# Patient Record
Sex: Female | Born: 1965 | Race: White | Hispanic: No | Smoking: Never smoker
Health system: Southern US, Community
[De-identification: ages and names within clinical notes are randomized; demographics above are authoritative.]

## PROBLEM LIST (undated history)

## (undated) DIAGNOSIS — D729 Disorder of white blood cells, unspecified: Secondary | ICD-10-CM

## (undated) DIAGNOSIS — Z973 Presence of spectacles and contact lenses: Secondary | ICD-10-CM

## (undated) DIAGNOSIS — E039 Hypothyroidism, unspecified: Secondary | ICD-10-CM

## (undated) DIAGNOSIS — F429 Obsessive-compulsive disorder, unspecified: Secondary | ICD-10-CM

## (undated) DIAGNOSIS — R112 Nausea with vomiting, unspecified: Secondary | ICD-10-CM

## (undated) DIAGNOSIS — T8859XA Other complications of anesthesia, initial encounter: Secondary | ICD-10-CM

## (undated) DIAGNOSIS — G629 Polyneuropathy, unspecified: Secondary | ICD-10-CM

## (undated) DIAGNOSIS — F419 Anxiety disorder, unspecified: Secondary | ICD-10-CM

## (undated) DIAGNOSIS — T4145XA Adverse effect of unspecified anesthetic, initial encounter: Secondary | ICD-10-CM

## (undated) DIAGNOSIS — Z9889 Other specified postprocedural states: Secondary | ICD-10-CM

## (undated) DIAGNOSIS — G501 Atypical facial pain: Secondary | ICD-10-CM

## (undated) HISTORY — PX: TONSILLECTOMY: SUR1361

## (undated) HISTORY — DX: Hypothyroidism, unspecified: E03.9

## (undated) HISTORY — DX: Polyneuropathy, unspecified: G62.9

---

## 2014-12-07 ENCOUNTER — Encounter: Payer: Self-pay | Admitting: Emergency Medicine

## 2014-12-07 ENCOUNTER — Ambulatory Visit: Payer: Self-pay

## 2014-12-07 ENCOUNTER — Emergency Department
Admission: EM | Admit: 2014-12-07 | Discharge: 2014-12-07 | Disposition: A | Discharge status: Home / Self Care | Payer: Self-pay | Attending: Emergency Medicine | Admitting: Emergency Medicine

## 2014-12-07 ENCOUNTER — Ambulatory Visit
Admission: EM | Admit: 2014-12-07 | Discharge: 2014-12-07 | Disposition: A | Discharge status: Home / Self Care | Payer: Self-pay | Attending: Family Medicine | Admitting: Family Medicine

## 2014-12-07 DIAGNOSIS — S82452A Displaced comminuted fracture of shaft of left fibula, initial encounter for closed fracture: Secondary | ICD-10-CM | POA: Insufficient documentation

## 2014-12-07 DIAGNOSIS — Y9289 Other specified places as the place of occurrence of the external cause: Secondary | ICD-10-CM | POA: Insufficient documentation

## 2014-12-07 DIAGNOSIS — Z79899 Other long term (current) drug therapy: Secondary | ICD-10-CM | POA: Insufficient documentation

## 2014-12-07 DIAGNOSIS — Y998 Other external cause status: Secondary | ICD-10-CM | POA: Insufficient documentation

## 2014-12-07 DIAGNOSIS — F419 Anxiety disorder, unspecified: Secondary | ICD-10-CM | POA: Insufficient documentation

## 2014-12-07 DIAGNOSIS — S82892A Other fracture of left lower leg, initial encounter for closed fracture: Secondary | ICD-10-CM

## 2014-12-07 DIAGNOSIS — W1839XA Other fall on same level, initial encounter: Secondary | ICD-10-CM | POA: Insufficient documentation

## 2014-12-07 DIAGNOSIS — Y9389 Activity, other specified: Secondary | ICD-10-CM | POA: Insufficient documentation

## 2014-12-07 HISTORY — DX: Anxiety disorder, unspecified: F41.9

## 2014-12-07 HISTORY — DX: Obsessive-compulsive disorder, unspecified: F42.9

## 2014-12-07 MED ORDER — KETOROLAC TROMETHAMINE 60 MG/2ML IM SOLN
60.0000 mg | Freq: Once | INTRAMUSCULAR | Status: AC
  Administered 2014-12-07: 60 mg via INTRAMUSCULAR

## 2014-12-07 MED ORDER — HYDROCODONE-ACETAMINOPHEN 5-325 MG PO TABS: 1.0000 | ORAL_TABLET | ORAL | Status: DC | PRN

## 2014-12-07 NOTE — ED Provider Notes (Signed)
CSN: [id number]     Arrival date & time 12/07/14  1506 History   First MD Initiated Contact with Patient 12/07/14 1609     Chief Complaint  Patient presents with  . Ankle Pain   (Consider location/radiation/quality/duration/timing/severity/associated sxs/prior Treatment) HPI  49 yo F twisted left ankle wearing high wedge shoes on Sunday  ( 3 days PTA) .Inversion injury.Pain and swelling steadily increasing- has been wearing gel stirrup she had at home Swelling now mid calf   Foot swollen and toes with decreased feeling. Has been walking on it at work today. New to area-just moved from [place]. Just establishing new job-tried to ignore pain to keep working.  Has no health insurance yet. Friend from [place] is here visiting and with her  Past Medical History  Diagnosis Date  . OCD (obsessive compulsive disorder)   . Anxiety    Past Surgical History  Procedure Laterality Date  . Tonsillectomy     Family History  Problem Relation Age of Onset  . Mental illness Mother    Social History  Substance Use Topics  . Smoking status: Never Smoker   . Smokeless tobacco: None  . Alcohol Use: Yes     Comment: daily   OB History    No data available     Review of Systems  Constitutional -afebrile Eyes-denies visual changes ENT- normal voice,denies sore throat CV-denies chest pain Resp-denies SOB GI- negative for nausea,vomiting, diarrhea GU- negative for dysuria MSK- negative for back pain, ambulatory antalgic, favoring left ankle very swollen Skin- denies acute changes Neuro- negative headache,focal weakness or numbness- left foot and toes with decreased sensation adequate ROM given amount of swelling when encouraged to move them      Allergies  Review of patient's allergies indicates no known allergies.  Home Medications   Prior to Admission medications   Medication Sig Start Date End Date Taking? Authorizing Provider  escitalopram (LEXAPRO) 5 MG tablet Take  5 mg by mouth daily.   Yes Historical Provider, MD  folic acid (FOLVITE) 1 MG tablet Take 1 mg by mouth daily.   Yes Historical Provider, MD  gabapentin (NEURONTIN) 300 MG capsule Take 300 mg by mouth 3 (three) times daily.   Yes Historical Provider, MD  levothyroxine (SYNTHROID, LEVOTHROID) 50 MCG tablet Take 50 mcg by mouth daily before breakfast.   Yes Historical Provider, MD  traZODone (DESYREL) 100 MG tablet Take 50 mg by mouth at bedtime as needed for sleep.   Yes Historical Provider, MD   Meds Ordered and Administered this Visit   Medications  ketorolac (TORADOL) injection 60 mg (60 mg Intramuscular Given 12/07/14 1719)    BP 152/85 mmHg  Pulse 72  Temp(Src) 97.6 F (36.4 C) (Tympanic)  Resp 16  Ht  (1.6 m)  Wt 158 lb (71.668 kg)  BMI 28.00 kg/m2  SpO2 100%  LMP 12/01/2014 (Exact Date) No data found.   Physical Exam   Constitutional -alert and oriented,well appearing and in mild/mod distress left ankle Head-atraumatic, normocephalic Eyes- conjunctiva normal, EOMI ,conjugate gaze Nose- no congestion or rhinorrhea Mouth/throat- mucous membranes moist Neck- supple without glandular enlargement CV- regular rate, grossly normal heart sounds,  Resp-no distress, normal respiratory effort,clear to auscultation bilaterally GI- soft,non-tender,no distention GU-  not examined MSK- left leg swollen from mid calf down including foot and toes; toes are pink and warm,good cap fill, but pulses cannot be evaluated given the swelling.She reports decreased sensation In lower leg and foot, increasingly notable in  past few hours. Calf is swollen but compresses with moderate discomfort and is soft. Left is larger than right (15 3/4 vs 14 5/8). No point tenderness. Lateral malleolus exquisitely tender to touch and medial is moderately tende, both swollen. She can flex and lift toes, can move ankle slightly only.Left midfoot is described as painful-compression does not increase pain but  significantly swollen limiting exam. Neuro- normal speech and language, no gross focal neurological deficit appreciated other than described left ankle/foot Skin-warm,dry ,intact; no rash noted Psych-mood and affect grossly normal; speech and behavior grossly normal  ED Course  Procedures (including critical care time)  Labs Review Labs Reviewed - No data to display  Imaging Review Dg Ankle Complete Left  12/07/2014   CLINICAL DATA:  Left ankle pain secondary to a fall 3 days ago.  EXAM: LEFT ANKLE COMPLETE - 3+ VIEW  COMPARISON:  None.  FINDINGS: There is a slightly displaced slightly comminuted spiral fracture of the distal fibula at the level of the ankle joint. There is circumferential soft tissue edema with an ankle joint effusion. The distal tibia is intact.  IMPRESSION: Slightly displaced comminuted spiral fracture of the distal fibula.   Electronically Signed   By: Francene Boyers M.D.   On: 12/07/2014 17:20   Dg Foot Complete Left  12/07/2014   CLINICAL DATA:  Left foot and ankle pain since a fall 3 days ago.  EXAM: LEFT FOOT - COMPLETE 3+ VIEW  COMPARISON:  None.  FINDINGS: There is a slightly displaced spiral fracture of the distal fibula. The bones of the foot are normal.  IMPRESSION: Normal left foot.  Distal fibula fracture.   Electronically Signed   By: Francene Boyers M.D.   On: 12/07/2014 17:19      MDM   1. Closed left ankle fracture, initial encounter    Plan: 1. X-ray results and diagnosis reviewed with patient and friend. She is new to area [facility] access reviewed with them . She opts for [facility] for proximity to her home/new friends. Attempts to reach Ortho on Call at [facility]  X 2 were unsuccessful- patient was counseled to report to ER for evaluation Given neurosensory changes, significant swelling and comminuted spiral fracture For Ortho evaluation given the report of increasing numbness and tingling in the foot. Direct referral to [facility] ER    In cam walker with  leg elevated. Private vehicle. [facility] notified   Rae Halsted, PA-C 12/08/14 [phone]

## 2014-12-07 NOTE — ED Notes (Signed)
Patient sent here from [facility] due to L ankle spiral fracture.

## 2014-12-07 NOTE — Discharge Instructions (Signed)
Please do not bear any weight with your left leg until Dr. Hyacinth Meeker says it is okay to do so.  Review the information provided and remember to keep your leg elevated as much as possible and use ice/cold packs appropriately to reduce swelling.  Read through the information about compartment syndrome; we do not believe this will happen, but it is important to know about.  Return to the emergency department with new or worsening symptoms that concern you.     Fibular Fracture, Ankle, Adult, Treated With or Without Immobilization A fibular fracture at your ankle is a break (fracture) bone in the smallest of the two bones in your lower leg, located on the outside of your leg (fibula) close to the area at your ankle joint. CAUSES  Rolling your ankle.  Twisting your ankle.  Extreme flexing or extending of your foot.  Severe force on your ankle as when falling from a distance. RISK FACTORS  Jumping activities.  Participation in sports.  Osteoporosis.  Advanced age.  Previous ankle injuries. SIGNS AND SYMPTOMS  Pain.  Swelling.  Inability to put weight on injured ankle.  Bruising.  Bone deformities at site of injury. DIAGNOSIS  This fracture is diagnosed with the help of an X-ray exam. TREATMENT  If the fractured bone did not move out of place it usually will heal without problems and does casting or splinting. If immobilization is needed for comfort or the fractured bone moved out of place and will not heal properly with immobilization, a cast or splint will be used. HOME CARE INSTRUCTIONS   Apply ice to the area of injury:  Put ice in a plastic bag.  Place a towel between your skin and the bag.  Leave the ice on for 20 minutes, 2-3 times a day.  Use crutches as directed. Resume walking without crutches as directed by your health care provider.  Only take over-the-counter or prescription medicines for pain, discomfort, or fever as directed by your health care  provider.  If you have a removable splint or boot, do not remove the boot unless directed by your health care provider. SEEK MEDICAL CARE IF:   You have continued pain or more swelling  The medications do not control the pain. SEEK IMMEDIATE MEDICAL CARE IF:  You develop severe pain in the leg or foot.  Your skin or nails below the injury turn blue or grey or feel cold or numb. MAKE SURE YOU:   Understand these instructions.  Will watch your condition.  Will get help right away if you are not doing well or get worse. Document Released: 03/11/2005 Document Revised: 12/30/2012 Document Reviewed: 10/21/2012 [facility] Patient Information 2015 [place], [place]. This information is not intended to replace advice given to you by your health care provider. Make sure you discuss any questions you have with your health care provider.

## 2014-12-07 NOTE — ED Provider Notes (Signed)
[facility] Emergency Department Provider Note  ____________________________________________  Time seen: Approximately 7:12 PM  I have reviewed the triage vital signs and the nursing notes.   HISTORY  Chief Complaint Ankle Pain    HPI Robin Madden is a 49 y.o. female with a history of anxiety but otherwise healthy who presents with pain and swelling to her left ankle and distal leg.  She reports that she sustained an inversion injury to the left ankle 3 days ago.  The pain was mild to moderate and she has actually been able to walk on the affected leg with a limp.  She has been keeping it elevated and using ice.  However, today she noticed significant swelling to the distal part of her leg into her foot and had some numbness and tingling of her toes.  She went to urgent care and had an x-ray which noted a slightly comminuted and slightly displaced distal fibular fracture.  She was sent to the emergency department for further evaluation.  She states that the pain is actually fairly mild.  She has normal sensation in the leg and foot but that sometimes her toes feel numb and tingly.  The swelling started today and is mild to moderate.  She states that the pain is actually fairly mild   Past Medical History  Diagnosis Date  . OCD (obsessive compulsive disorder)   . Anxiety     There are no active problems to display for this patient.   Past Surgical History  Procedure Laterality Date  . Tonsillectomy      Current Outpatient Rx  Name  Route  Sig  Dispense  Refill  . escitalopram (LEXAPRO) 5 MG tablet   Oral   Take 5 mg by mouth daily.         . folic acid (FOLVITE) 1 MG tablet   Oral   Take 1 mg by mouth daily.         Marland Kitchen gabapentin (NEURONTIN) 300 MG capsule   Oral   Take 300 mg by mouth 3 (three) times daily.         Marland Kitchen levothyroxine (SYNTHROID, LEVOTHROID) 50 MCG tablet   Oral   Take 50 mcg by mouth daily before breakfast.          . traZODone (DESYREL) 100 MG tablet   Oral   Take 50 mg by mouth at bedtime as needed for sleep.           Allergies Review of patient's allergies indicates no known allergies.  Family History  Problem Relation Age of Onset  . Mental illness Mother     Social History Social History  Substance Use Topics  . Smoking status: Never Smoker   . Smokeless tobacco: None  . Alcohol Use: Yes     Comment: daily    Review of Systems Constitutional: No fever/chills Eyes: No visual changes. ENT: No sore throat. Cardiovascular: Denies chest pain. Respiratory: Denies shortness of breath. Gastrointestinal: No abdominal pain.  No nausea, no vomiting.  No diarrhea.  No constipation. Genitourinary: Negative for dysuria. Musculoskeletal: pain and swelling in the distal left leg and ankle with some numbness and tingling in the toes. Skin: Negative for rash. Neurological: numbness and tingling in the left toes, otherwise no focal deficits  10-point ROS otherwise negative.  ____________________________________________   PHYSICAL EXAM:  VITAL SIGNS: ED Triage Vitals  Enc Vitals Group     BP 12/07/14 1848 171/82 mmHg     Pulse  Rate 12/07/14 1848 79     Resp 12/07/14 1848 20     Temp 12/07/14 1848 98.3 F (36.8 C)     Temp Source 12/07/14 1848 Oral     SpO2 12/07/14 1848 100 %     Weight 12/07/14 1848 145 lb (65.772 kg)     Height 12/07/14 1848 5\' 4"  (1.626 m)     Head Cir --      Peak Flow --      Pain Score 12/07/14 1849 8     Pain Loc --      Pain Edu? --      Excl. in GC? --     Constitutional: Alert and oriented. Well appearing and in no acute distress.mildly anxious. Eyes: Conjunctivae are normal. PERRL. EOMI. Head: Atraumatic. Neck: No stridor.  No cervical spine tenderness to palpation. Cardiovascular: Normal rate, regular rhythm. Grossly normal heart sounds.  Good peripheral circulation. Respiratory: Normal respiratory effort.  No retractions. Lungs  CTAB. Musculoskeletal: mild to moderate swelling of the left foot and left lower extremity below the knee.  There is tenderness to palpation throughout although it is severe at the lateral malleolus and just proximal to that consistent with the location of her fracture on x-ray.  Though she does have swelling throughout the extremity, her compartments are soft and easily compressible with minimal pain.  She has normal sensation to light touch throughout her leg and foot. Neurologic:  Normal speech and language. No gross focal neurologic deficits are appreciated.  Skin:  Skin is warm, dry and intact. No rash noted. Psychiatric: Mood and affect are normal. Speech and behavior are normal.  ____________________________________________   LABS (all labs ordered are listed, but only abnormal results are displayed)  Labs Reviewed - No data to display ____________________________________________  EKG  Not indicated ____________________________________________  RADIOLOGY I, Treina Arscott, personally viewed and evaluated these images (plain radiographs) as part of my medical decision making.   Dg Ankle Complete Left  12/07/2014   CLINICAL DATA:  Left ankle pain secondary to a fall 3 days ago.  EXAM: LEFT ANKLE COMPLETE - 3+ VIEW  COMPARISON:  None.  FINDINGS: There is a slightly displaced slightly comminuted spiral fracture of the distal fibula at the level of the ankle joint. There is circumferential soft tissue edema with an ankle joint effusion. The distal tibia is intact.  IMPRESSION: Slightly displaced comminuted spiral fracture of the distal fibula.   Electronically Signed   By: Francene Boyers M.D.   On: 12/07/2014 17:20   Dg Foot Complete Left  12/07/2014   CLINICAL DATA:  Left foot and ankle pain since a fall 3 days ago.  EXAM: LEFT FOOT - COMPLETE 3+ VIEW  COMPARISON:  None.  FINDINGS: There is a slightly displaced spiral fracture of the distal fibula. The bones of the foot are normal.   IMPRESSION: Normal left foot.  Distal fibula fracture.   Electronically Signed   By: Francene Boyers M.D.   On: 12/07/2014 17:19    ____________________________________________   PROCEDURES  Procedure(s) performed: splint, see procedure note(s).   SPLINT APPLICATION Date/Time: 7:49 PM Authorized by: Loleta Rose Consent: Verbal consent obtained. Risks and benefits: risks, benefits and alternatives were discussed Consent given by: patient Splint applied by: ED tech Location details: left lower extremity Splint type: posterior short leg Supplies used: orthoglass Post-procedure: The splinted body part was neurovascularly unchanged following the procedure. Patient tolerance: Patient tolerated the procedure well with no immediate complications.  Critical Care  performed: No ____________________________________________   INITIAL IMPRESSION / ASSESSMENT AND PLAN / ED COURSE  Pertinent labs & imaging results that were available during my care of the patient were reviewed by me and considered in my medical decision making (see chart for details).  I understand the concern for compartment syndrome but at this time the patient has no signs or symptoms of compartment syndrome.  She is having what I believe is a normal amount of swelling given her injury.  I explained this to her and her companion.  I have called Dr. Hyacinth Meeker for his evaluation.  ----------------------------------------- 7:29 PM on 12/07/2014 -----------------------------------------  Spoke by phone with Dr. Hyacinth Meeker who will review the imaging and call me back.  ----------------------------------------- 7:47 PM on 12/07/2014 -----------------------------------------  Dr. Hyacinth Meeker called back to let me know that he feels that immobilization in a posterior short leg splint and follow-up in 2 days in clinic as appropriate.I relayed this information to the patient and her companion and reiterated the RICE recommendations and  NWB.  She has crutches at home.  I gave my usual and customary return precautions including extensive discussion of compartment syndrome. ____________________________________________  FINAL CLINICAL IMPRESSION(S) / ED DIAGNOSES  Final diagnoses:  Closed displaced comminuted fracture of shaft of fibula, left, initial encounter      NEW MEDICATIONS STARTED DURING THIS VISIT:  New Prescriptions   No medications on file     Loleta Rose, MD 12/07/14 1949

## 2014-12-07 NOTE — ED Notes (Signed)
Wearing wedge shoes and rolled Sunday twisting left ankle. + swelling, and up calf. Left calf 15 3/4 inchs. Right calf mearsures 14 5/8 inches. "My foot feels numb"

## 2015-03-29 ENCOUNTER — Ambulatory Visit: Payer: Self-pay | Admitting: Internal Medicine

## 2015-04-02 ENCOUNTER — Encounter: Payer: Self-pay | Admitting: Internal Medicine

## 2015-04-04 ENCOUNTER — Ambulatory Visit (INDEPENDENT_AMBULATORY_CARE_PROVIDER_SITE_OTHER): Payer: Managed Care, Other (non HMO) | Admitting: Internal Medicine

## 2015-04-04 ENCOUNTER — Encounter: Payer: Self-pay | Admitting: Internal Medicine

## 2015-04-04 VITALS — BP 132/80 | HR 86 | Ht 64.0 in | Wt 160.6 lb

## 2015-04-04 DIAGNOSIS — Z1239 Encounter for other screening for malignant neoplasm of breast: Secondary | ICD-10-CM | POA: Diagnosis not present

## 2015-04-04 DIAGNOSIS — D729 Disorder of white blood cells, unspecified: Secondary | ICD-10-CM | POA: Diagnosis not present

## 2015-04-04 DIAGNOSIS — E034 Atrophy of thyroid (acquired): Secondary | ICD-10-CM

## 2015-04-04 DIAGNOSIS — Z8742 Personal history of other diseases of the female genital tract: Secondary | ICD-10-CM

## 2015-04-04 DIAGNOSIS — F429 Obsessive-compulsive disorder, unspecified: Secondary | ICD-10-CM | POA: Diagnosis not present

## 2015-04-04 DIAGNOSIS — G501 Atypical facial pain: Secondary | ICD-10-CM

## 2015-04-04 DIAGNOSIS — G4700 Insomnia, unspecified: Secondary | ICD-10-CM | POA: Diagnosis not present

## 2015-04-04 DIAGNOSIS — E038 Other specified hypothyroidism: Secondary | ICD-10-CM

## 2015-04-04 DIAGNOSIS — Z8781 Personal history of (healed) traumatic fracture: Secondary | ICD-10-CM

## 2015-04-04 DIAGNOSIS — Z23 Encounter for immunization: Secondary | ICD-10-CM

## 2015-04-04 DIAGNOSIS — F5101 Primary insomnia: Secondary | ICD-10-CM | POA: Insufficient documentation

## 2015-04-04 DIAGNOSIS — D7589 Other specified diseases of blood and blood-forming organs: Secondary | ICD-10-CM | POA: Diagnosis not present

## 2015-04-04 DIAGNOSIS — E039 Hypothyroidism, unspecified: Secondary | ICD-10-CM | POA: Insufficient documentation

## 2015-04-04 HISTORY — DX: Personal history of (healed) traumatic fracture: Z87.81

## 2015-04-04 HISTORY — DX: Personal history of other diseases of the female genital tract: Z87.42

## 2015-04-04 MED ORDER — LEVOTHYROXINE SODIUM 100 MCG PO TABS: 100.0000 ug | ORAL_TABLET | Freq: Every day | ORAL | Status: DC

## 2015-04-04 MED ORDER — ZOLPIDEM TARTRATE 5 MG PO TABS: 5.0000 mg | ORAL_TABLET | Freq: Every evening | ORAL | Status: DC | PRN

## 2015-04-04 MED ORDER — ESCITALOPRAM OXALATE 5 MG PO TABS: 5.0000 mg | ORAL_TABLET | Freq: Every day | ORAL | Status: DC

## 2015-04-04 MED ORDER — GABAPENTIN 300 MG PO CAPS: 300.0000 mg | ORAL_CAPSULE | Freq: Every day | ORAL | Status: DC

## 2015-04-04 NOTE — Progress Notes (Signed)
Date:  04/04/2015   Name:  Robin Madden   DOB:  [birth year]   MRN:  [id number]   Chief Complaint: New Evaluation and Thyroid Problem Thyroid Problem Presents for follow-up visit. Patient reports no anxiety, depressed mood, diarrhea, fatigue, heat intolerance, hoarse voice, leg swelling, menstrual problem or palpitations. The symptoms have been stable. Past treatments include levothyroxine. The treatment provided significant (has been on supplements for years) relief.   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 Please evaluation by neurology ruled out trigeminal neuralgia, sinusitis, and migraine. She was treated with Neurontin which she took for a period of time and then discontinued. His past week she had some occurrence of symptoms so resumed 300 mg at bedtime with good results.  Mild OCD - she has been taking Lexapro 5 mg daily for OCD. She ran out of her medication about one month ago and has noted some mild increase in symptoms. She believes she would like to keep taking the medication because she still adjusting to her move and her new job.   Intermittent panic disorder - she has low-dose lorazepam which she uses very rarely for extreme anxiety inducing situations. She currently has sufficient tablets to last her for a number of months.  Chronic insomnia - patient has been taking trazodone 50 mg at bedtime intermittently for some time. It helps her sleep but she has very vivid nightmares and often also has drenching night sweats. She has tried Ambien briefly in the past with good results. She is also sleeping better since taking gabapentin.  History of abnormal Pap smear with HPV positive - last Pap was 1 year ago was normal. She has had no history of LEEP or conization. She has had a cervical biopsy which was benign.  Hematologic abnormality - patient states she's been evaluated in the past for red blood cell microcytosis as well as an  abnormal white blood count. She is uncertain whether her white count is high or low. Pathology has cleared her for any malignancy or neoplastic process. They recommend that she take folate, B6 and B12 daily.  Fibula fracture - she sustained a fibular fracture when she turned her ankle in July. Has been seen by orthopedics and they recommend conservative treatment out surgery. She still has some ankle pain with movement but it is improving.  Review of Systems  Constitutional: Negative for fever, chills and fatigue.  HENT: Positive for facial swelling (and pain on right). Negative for hoarse voice, trouble swallowing and voice change.   Respiratory: Negative for cough, chest tightness and shortness of breath.   Cardiovascular: Negative for chest pain and palpitations.  Gastrointestinal: Negative for abdominal pain and diarrhea.  Endocrine: Negative for heat intolerance.  Genitourinary: Negative for vaginal discharge, vaginal pain, menstrual problem and pelvic pain.  Neurological: Negative for syncope, light-headedness and headaches.  Psychiatric/Behavioral: Positive for sleep disturbance. Negative for dysphoric mood and decreased concentration. The patient is not nervous/anxious.     There are no active problems to display for this patient.   Prior to Admission medications   Medication Sig Start Date End Date Taking? Authorizing Provider  gabapentin (NEURONTIN) 300 MG capsule Take 300 mg by mouth 3 (three) times daily.   Yes Historical Provider, MD  l-methylfolate-B6-B12 (METANX) 3-35-2 MG TABS tablet Take 1 tablet by mouth daily.   Yes Historical Provider, MD  levothyroxine (SYNTHROID, LEVOTHROID) 100 MCG tablet Take 100 mcg by mouth daily before breakfast.  Yes Historical Provider, MD  LORazepam (ATIVAN) 0.5 MG tablet Take 0.5 mg by mouth as needed for anxiety.   Yes Historical Provider, MD  traZODone (DESYREL) 100 MG tablet Take 50 mg by mouth at bedtime as needed for sleep.   Yes  Historical Provider, MD  escitalopram (LEXAPRO) 5 MG tablet Take 5 mg by mouth daily. Reported on 04/04/2015    Historical Provider, MD    No Known Allergies  Past Surgical History  Procedure Laterality Date  . Tonsillectomy      Social History  Substance Use Topics  . Smoking status: Never Smoker   . Smokeless tobacco: None  . Alcohol Use: 0.0 oz/week    0 Standard drinks or equivalent per week     Comment: daily     Medication list has been reviewed and updated.   Physical Exam  Constitutional: She is oriented to person, place, and time. She appears well-developed. No distress.  HENT:  Head: Normocephalic and atraumatic.  Eyes: Conjunctivae are normal. Right eye exhibits no discharge. Left eye exhibits no discharge. No scleral icterus.  Neck: Normal range of motion. Neck supple. No thyromegaly present.  Cardiovascular: Normal rate, regular rhythm and normal heart sounds.   Pulmonary/Chest: Effort normal and breath sounds normal. No respiratory distress. She has no wheezes.  Musculoskeletal:       Left ankle: She exhibits decreased range of motion. Tenderness. Lateral malleolus tenderness found.  Lymphadenopathy:    She has no cervical adenopathy.  Neurological: She is alert and oriented to person, place, and time. She has normal reflexes.  Skin: Skin is warm and dry. No rash noted.  Psychiatric: She has a normal mood and affect. Her behavior is normal. Thought content normal.  Nursing note and vitals reviewed.   BP 132/80 mmHg  Pulse 86  Ht 5\' 4"  (1.626 m)  Wt 160 lb 9.6 oz (72.848 kg)  BMI 27.55 kg/m2  SpO2 100%  LMP 03/23/2015  Assessment and Plan: 1. Hypothyroidism due to acquired atrophy of thyroid Continue supplements Check labs at return visit - levothyroxine (SYNTHROID, LEVOTHROID) 100 MCG tablet; Take 1 tablet (100 mcg total) by mouth daily before breakfast.  Dispense: 30 tablet; Refill: 5  2. OCD (obsessive compulsive disorder) Continue Lexapro -  escitalopram (LEXAPRO) 5 MG tablet; Take 1 tablet (5 mg total) by mouth daily. Reported on 04/04/2015  Dispense: 30 tablet; Refill: 0  3. Atypical facial pain Improved with gabapentin - gabapentin (NEURONTIN) 300 MG capsule; Take 1 capsule (300 mg total) by mouth at bedtime.  Dispense: 30 capsule; Refill: 5  4. Insomnia Discontinue trazodone and begin Ambien as needed - zolpidem (AMBIEN) 5 MG tablet; Take 1 tablet (5 mg total) by mouth at bedtime as needed for sleep.  Dispense: 30 tablet; Refill: 1  5. Abnormal WBC count Will monitor with labs  6. Macrocytosis Continue folic acid and B12 and B6 supplements  7. Hx of abnormal cervical Pap smear We will repeat at time of her physical  8. Breast cancer screening - MM DIGITAL SCREENING BILATERAL; Future  9. Need for influenza vaccination - Flu Vaccine QUAD 36+ mos IM  10. History of fracture of fibula Continue follow up with Orthopedics   Bari EdwardLaura Berglund, MD [facility]  04/04/2015

## 2015-04-12 ENCOUNTER — Ambulatory Visit
Admission: RE | Admit: 2015-04-12 | Discharge: 2015-04-12 | Disposition: A | Discharge status: Home / Self Care | Payer: Managed Care, Other (non HMO) | Source: Ambulatory Visit | Attending: Internal Medicine | Admitting: Internal Medicine

## 2015-04-12 DIAGNOSIS — Z1239 Encounter for other screening for malignant neoplasm of breast: Secondary | ICD-10-CM

## 2015-04-12 DIAGNOSIS — Z1231 Encounter for screening mammogram for malignant neoplasm of breast: Secondary | ICD-10-CM | POA: Diagnosis not present

## 2015-05-11 ENCOUNTER — Other Ambulatory Visit: Payer: Self-pay | Admitting: Internal Medicine

## 2015-07-11 ENCOUNTER — Encounter: Payer: Managed Care, Other (non HMO) | Admitting: Internal Medicine

## 2015-09-20 ENCOUNTER — Ambulatory Visit (INDEPENDENT_AMBULATORY_CARE_PROVIDER_SITE_OTHER): Payer: Managed Care, Other (non HMO) | Admitting: Internal Medicine

## 2015-09-20 ENCOUNTER — Encounter: Payer: Self-pay | Admitting: Internal Medicine

## 2015-09-20 VITALS — BP 120/82 | HR 69 | Resp 16 | Ht 64.0 in | Wt 156.2 lb

## 2015-09-20 DIAGNOSIS — G501 Atypical facial pain: Secondary | ICD-10-CM

## 2015-09-20 DIAGNOSIS — F429 Obsessive-compulsive disorder, unspecified: Secondary | ICD-10-CM | POA: Diagnosis not present

## 2015-09-20 DIAGNOSIS — Z8742 Personal history of other diseases of the female genital tract: Secondary | ICD-10-CM | POA: Diagnosis not present

## 2015-09-20 DIAGNOSIS — G4700 Insomnia, unspecified: Secondary | ICD-10-CM

## 2015-09-20 DIAGNOSIS — E038 Other specified hypothyroidism: Secondary | ICD-10-CM

## 2015-09-20 DIAGNOSIS — Z0000 Encounter for general adult medical examination without abnormal findings: Secondary | ICD-10-CM | POA: Diagnosis not present

## 2015-09-20 DIAGNOSIS — E034 Atrophy of thyroid (acquired): Secondary | ICD-10-CM | POA: Diagnosis not present

## 2015-09-20 DIAGNOSIS — Z23 Encounter for immunization: Secondary | ICD-10-CM | POA: Diagnosis not present

## 2015-09-20 LAB — POCT URINALYSIS DIPSTICK
Bilirubin, UA: NEGATIVE
GLUCOSE UA: NEGATIVE
KETONES UA: NEGATIVE
LEUKOCYTES UA: NEGATIVE
NITRITE UA: NEGATIVE
Protein, UA: NEGATIVE
RBC UA: NEGATIVE
SPEC GRAV UA: 1.02
Urobilinogen, UA: 0.2
pH, UA: 5

## 2015-09-20 MED ORDER — LEVOTHYROXINE SODIUM 100 MCG PO TABS: 100.0000 ug | ORAL_TABLET | Freq: Every day | ORAL | Status: DC

## 2015-09-20 MED ORDER — ESCITALOPRAM OXALATE 10 MG PO TABS: 10.0000 mg | ORAL_TABLET | Freq: Every day | ORAL | Status: DC

## 2015-09-20 MED ORDER — ZOLPIDEM TARTRATE 5 MG PO TABS: 5.0000 mg | ORAL_TABLET | Freq: Every evening | ORAL | Status: DC | PRN

## 2015-09-20 MED ORDER — GABAPENTIN 300 MG PO CAPS: 300.0000 mg | ORAL_CAPSULE | Freq: Every day | ORAL | Status: DC

## 2015-09-20 MED ORDER — TRAZODONE HCL 50 MG PO TABS: 50.0000 mg | ORAL_TABLET | Freq: Every evening | ORAL | Status: DC | PRN

## 2015-09-20 NOTE — Patient Instructions (Addendum)
Breast Self-Awareness Practicing breast self-awareness may pick up problems early, prevent significant medical complications, and possibly save your life. By practicing breast self-awareness, you can become familiar with how your breasts look and feel and if your breasts are changing. This allows you to notice changes early. It can also offer you some reassurance that your breast health is good. One way to learn what is normal for your breasts and whether your breasts are changing is to do a breast self-exam. If you find a lump or something that was not present in the past, it is best to contact your caregiver right away. Other findings that should be evaluated by your caregiver include nipple discharge, especially if it is bloody; skin changes or reddening; areas where the skin seems to be pulled in (retracted); or new lumps and bumps. Breast pain is seldom associated with cancer (malignancy), but should also be evaluated by a caregiver. HOW TO PERFORM A BREAST SELF-EXAM The best time to examine your breasts is 5-7 days after your menstrual period is over. During menstruation, the breasts are lumpier, and it may be more difficult to pick up changes. If you do not menstruate, have reached menopause, or had your uterus removed (hysterectomy), you should examine your breasts at regular intervals, such as monthly. If you are breastfeeding, examine your breasts after a feeding or after using a breast pump. Breast implants do not decrease the risk for lumps or tumors, so continue to perform breast self-exams as recommended. Talk to your caregiver about how to determine the difference between the implant and breast tissue. Also, talk about the amount of pressure you should use during the exam. Over time, you will become more familiar with the variations of your breasts and more comfortable with the exam. A breast self-exam requires you to remove all your clothes above the waist.  Look at your breasts and nipples.  Stand in front of a mirror in a room with good lighting. With your hands on your hips, push your hands firmly downward. Look for a difference in shape, contour, and size from one breast to the other (asymmetry). Asymmetry includes puckers, dips, or bumps. Also, look for skin changes, such as reddened or scaly areas on the breasts. Look for nipple changes, such as discharge, dimpling, repositioning, or redness.  Carefully feel your breasts. This is best done either in the shower or tub while using soapy water or when flat on your back. Place the arm (on the side of the breast you are examining) above your head. Use the pads (not the fingertips) of your three middle fingers on your opposite hand to feel your breasts. Start in the underarm area and use  inch (2 cm) overlapping circles to feel your breast. Use 3 different levels of pressure (light, medium, and firm pressure) at each circle before moving to the next circle. The light pressure is needed to feel the tissue closest to the skin. The medium pressure will help to feel breast tissue a little deeper, while the firm pressure is needed to feel the tissue close to the ribs. Continue the overlapping circles, moving downward over the breast until you feel your ribs below your breast. Then, move one finger-width towards the center of the body. Continue to use the  inch (2 cm) overlapping circles to feel your breast as you move slowly up toward the collar bone (clavicle) near the base of the neck. Continue the up and down exam using all 3 pressures until you reach the   middle of the chest. Do this with each breast, carefully feeling for lumps or changes.   Keep a written record with breast changes or normal findings for each breast. By writing this information down, you do not need to depend only on memory for size, tenderness, or location. Write down where you are in your menstrual cycle, if you are still menstruating. Breast tissue can have some lumps or thick  tissue. However, see your caregiver if you find anything that concerns you.  SEEK MEDICAL CARE IF:  You see a change in shape, contour, or size of your breasts or nipples.   You see skin changes, such as reddened or scaly areas on the breasts or nipples.   You have an unusual discharge from your nipples.   You feel a new lump or unusually thick areas.    This information is not intended to replace advice given to you by your health care provider. Make sure you discuss any questions you have with your health care provider.   Document Released: 03/11/2005 Document Revised: 02/26/2012 Document Reviewed: 06/26/2011 Elsevier Interactive Patient Education 2016 [employer] Tdap Vaccine (Tetanus, Diphtheria and Pertussis): What You Need to Know 1. Why get vaccinated? Tetanus, diphtheria and pertussis are very serious diseases. Tdap vaccine can protect us from these diseases. And, Tdap vaccine given to pregnant women can protect newborn babies against pertussis. TETANUS (Lockjaw) is rare in the [place] today. It causes painful muscle tightening and stiffness, usually all over the body.  It can lead to tightening of muscles in the head and neck so you can't open your mouth, swallow, or sometimes even breathe. Tetanus kills about 1 out of 10 people who are infected even after receiving the best medical care. DIPHTHERIA is also rare in the [place] today. It can cause a thick coating to form in the back of the throat.  It can lead to breathing problems, heart failure, paralysis, and death. PERTUSSIS (Whooping Cough) causes severe coughing spells, which can cause difficulty breathing, vomiting and disturbed sleep.  It can also lead to weight loss, incontinence, and rib fractures. Up to 2 in 100 adolescents and 5 in 100 adults with pertussis are hospitalized or have complications, which could include pneumonia or death. These diseases are caused by bacteria. Diphtheria and pertussis  are spread from person to person through secretions from coughing or sneezing. Tetanus enters the body through cuts, scratches, or wounds. Before vaccines, as many as 200,000 cases of diphtheria, 200,000 cases of pertussis, and hundreds of cases of tetanus, were reported in the [place] each year. Since vaccination began, reports of cases for tetanus and diphtheria have dropped by about 99% and for pertussis by about 80%. 2. Tdap vaccine Tdap vaccine can protect adolescents and adults from tetanus, diphtheria, and pertussis. One dose of Tdap is routinely given at age 11 or 12. People who did not get Tdap at that age should get it as soon as possible. Tdap is especially important for healthcare professionals and anyone having close contact with a baby younger than 12 months. Pregnant women should get a dose of Tdap during every pregnancy, to protect the newborn from pertussis. Infants are most at risk for severe, life-threatening complications from pertussis. Another vaccine, called Td, protects against tetanus and diphtheria, but not pertussis. A Td booster should be given every 10 years. Tdap may be given as one of these boosters if you have never gotten Tdap before. Tdap may also be given after a severe   cut or burn to prevent tetanus infection. Your doctor or the person giving you the vaccine can give you more information. Tdap may safely be given at the same time as other vaccines. 3. Some people should not get this vaccine  A person who has ever had a life-threatening allergic reaction after a previous dose of any diphtheria, tetanus or pertussis containing vaccine, OR has a severe allergy to any part of this vaccine, should not get Tdap vaccine. Tell the person giving the vaccine about any severe allergies.  Anyone who had coma or long repeated seizures within 7 days after a childhood dose of DTP or DTaP, or a previous dose of Tdap, should not get Tdap, unless a cause other than the vaccine  was found. They can still get Td.  Talk to your doctor if you:  have seizures or another nervous system problem,  had severe pain or swelling after any vaccine containing diphtheria, tetanus or pertussis,  ever had a condition called Guillain-Barr Syndrome (GBS),  aren't feeling well on the day the shot is scheduled. 4. Risks With any medicine, including vaccines, there is a chance of side effects. These are usually mild and go away on their own. Serious reactions are also possible but are rare. Most people who get Tdap vaccine do not have any problems with it. Mild problems following Tdap (Did not interfere with activities)  Pain where the shot was given (about 3 in 4 adolescents or 2 in 3 adults)  Redness or swelling where the shot was given (about 1 person in 5)  Mild fever of at least 100.4F (up to about 1 in 25 adolescents or 1 in 100 adults)  Headache (about 3 or 4 people in 10)  Tiredness (about 1 person in 3 or 4)  Nausea, vomiting, diarrhea, stomach ache (up to 1 in 4 adolescents or 1 in 10 adults)  Chills, sore joints (about 1 person in 10)  Body aches (about 1 person in 3 or 4)  Rash, swollen glands (uncommon) Moderate problems following Tdap (Interfered with activities, but did not require medical attention)  Pain where the shot was given (up to 1 in 5 or 6)  Redness or swelling where the shot was given (up to about 1 in 16 adolescents or 1 in 12 adults)  Fever over 102F (about 1 in 100 adolescents or 1 in 250 adults)  Headache (about 1 in 7 adolescents or 1 in 10 adults)  Nausea, vomiting, diarrhea, stomach ache (up to 1 or 3 people in 100)  Swelling of the entire arm where the shot was given (up to about 1 in 500). Severe problems following Tdap (Unable to perform usual activities; required medical attention)  Swelling, severe pain, bleeding and redness in the arm where the shot was given (rare). Problems that could happen after any  vaccine:  People sometimes faint after a medical procedure, including vaccination. Sitting or lying down for about 15 minutes can help prevent fainting, and injuries caused by a fall. Tell your doctor if you feel dizzy, or have vision changes or ringing in the ears.  Some people get severe pain in the shoulder and have difficulty moving the arm where a shot was given. This happens very rarely.  Any medication can cause a severe allergic reaction. Such reactions from a vaccine are very rare, estimated at fewer than 1 in a million doses, and would happen within a few minutes to a few hours after the vaccination. As with any medicine,   there is a very remote chance of a vaccine causing a serious injury or death. The safety of vaccines is always being monitored. For more information, visit: www.cdc.gov/vaccinesafety/ 5. What if there is a serious problem? What should I look for?  Look for anything that concerns you, such as signs of a severe allergic reaction, very high fever, or unusual behavior.  Signs of a severe allergic reaction can include hives, swelling of the face and throat, difficulty breathing, a fast heartbeat, dizziness, and weakness. These would usually start a few minutes to a few hours after the vaccination. What should I do?  If you think it is a severe allergic reaction or other emergency that can't wait, call 9-1-1 or get the person to the nearest hospital. Otherwise, call your doctor.  Afterward, the reaction should be reported to the Vaccine Adverse Event Reporting System (VAERS). Your doctor might file this report, or you can do it yourself through the VAERS web site at www.vaers.hhs.gov, or by calling 1-800-822-7967. VAERS does not give medical advice.  6. The National Vaccine Injury Compensation Program The National Vaccine Injury Compensation Program (VICP) is a federal program that was created to compensate people who may have been injured by certain vaccines. Persons who  believe they may have been injured by a vaccine can learn about the program and about filing a claim by calling 1-800-338-2382 or visiting the VICP website at www.hrsa.gov/vaccinecompensation. There is a time limit to file a claim for compensation. 7. How can I learn more?  Ask your doctor. He or she can give you the vaccine package insert or suggest other sources of information.  Call your local or state health department.  Contact the Centers for Disease Control and Prevention (CDC):  Call 1-800-232-4636 (1-800-CDC-INFO) or  Visit CDC's website at www.cdc.gov/vaccines CDC Tdap Vaccine VIS (05/18/13)   This information is not intended to replace advice given to you by your health care provider. Make sure you discuss any questions you have with your health care provider.   Document Released: 09/10/2011 Document Revised: 04/01/2014 Document Reviewed: 06/23/2013 Elsevier Interactive Patient Education 2016 [employer] Inc[employer]  

## 2015-09-20 NOTE — Progress Notes (Signed)
Date:  09/20/2015   Name:  Robin Madden   DOB:  10-31-1965   MRN:  [id number]   Chief Complaint: Annual Exam Robin M Stevphen RochesterFrenette is a 50 y.o. female who presents today for her Complete Annual Exam. She feels well. She reports exercising regularly. She reports she is sleeping fairly well using ambien and trazodone. She denies any breast mass or tenderness.  She has still been having periods but is a few days late today.  She takes lexapro for anxiety - previously on 5 mg but tried 10 mg and felt better.   Review of Systems  Constitutional: Negative for fever, chills and fatigue.  HENT: Negative for congestion, hearing loss, tinnitus, trouble swallowing and voice change.   Eyes: Negative for visual disturbance.  Respiratory: Negative for cough, chest tightness, shortness of breath and wheezing.   Cardiovascular: Negative for chest pain, palpitations and leg swelling.  Gastrointestinal: Negative for vomiting, abdominal pain, diarrhea and constipation.  Endocrine: Negative for polydipsia and polyuria.  Genitourinary: Negative for dysuria, frequency, vaginal discharge, genital sores, menstrual problem and pelvic pain.  Musculoskeletal: Negative for joint swelling, arthralgias and gait problem.  Skin: Negative for color change and rash.  Neurological: Negative for dizziness, tremors, light-headedness and headaches.  Hematological: Negative for adenopathy. Does not bruise/bleed easily.  Psychiatric/Behavioral: Positive for sleep disturbance. Negative for confusion, dysphoric mood and agitation. The patient is nervous/anxious.     Patient Active Problem List   Diagnosis Date Noted  . Hypothyroidism 04/04/2015  . OCD (obsessive compulsive disorder) 04/04/2015  . 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 Insomnia 04/04/2015  . Abnormal WBC count 04/04/2015  . Macrocytosis 04/04/2015  . Hx of abnormal cervical Pap smear 04/04/2015  . History of fracture of fibula 04/04/2015    Prior to  Admission medications   Medication Sig Start Date End Date Taking? Authorizing Provider  escitalopram (LEXAPRO) 10 MG tablet  08/14/15   Historical Provider, MD  gabapentin (NEURONTIN) 300 MG capsule Take 1 capsule (300 mg total) by mouth at bedtime. 04/04/15   Reubin MilanLaura H Cornie Herrington, MD  l-methylfolate-B6-B12 (METANX) 3-35-2 MG TABS tablet Take 1 tablet by mouth daily.    Historical Provider, MD  levothyroxine (SYNTHROID, LEVOTHROID) 100 MCG tablet Take 1 tablet (100 mcg total) by mouth daily before breakfast. 04/04/15   Reubin MilanLaura H Kalsey Lull, MD  LORazepam (ATIVAN) 0.5 MG tablet Take 0.5 mg by mouth as needed for anxiety.    Historical Provider, MD  traZODone (DESYREL) 100 MG tablet Take 50 mg by mouth at bedtime as needed for sleep.    Historical Provider, MD  zolpidem (AMBIEN) 5 MG tablet Take 1 tablet (5 mg total) by mouth at bedtime as needed for sleep. 04/04/15   Reubin MilanLaura H Zaria Taha, MD    No Known Allergies  Past Surgical History  Procedure Laterality Date  . Tonsillectomy      Social History  Substance Use Topics  . Smoking status: Never Smoker   . Smokeless tobacco: None  . Alcohol Use: 0.0 oz/week    0 Standard drinks or equivalent per week     Comment: daily     Medication list has been reviewed and updated.   Physical Exam  Constitutional: She is oriented to person, place, and time. She appears well-developed and well-nourished. No distress.  HENT:  Head: Normocephalic and atraumatic.  Right Ear: Tympanic membrane and ear canal normal.  Left Ear: Tympanic membrane and ear canal normal.  Nose: Right sinus exhibits no maxillary sinus  tenderness. Left sinus exhibits no maxillary sinus tenderness.  Mouth/Throat: Uvula is midline and oropharynx is clear and moist.  Eyes: Conjunctivae and EOM are normal. Right eye exhibits no discharge. Left eye exhibits no discharge. No scleral icterus.  Neck: Normal range of motion. Carotid bruit is not present. No erythema present. No thyromegaly  present.  Cardiovascular: Normal rate, regular rhythm, normal heart sounds and normal pulses.   Pulmonary/Chest: Effort normal and breath sounds normal. No respiratory distress. She has no wheezes. Right breast exhibits no mass, no nipple discharge, no skin change and no tenderness. Left breast exhibits no mass, no nipple discharge, no skin change and no tenderness.  Abdominal: Soft. Bowel sounds are normal. There is no hepatosplenomegaly. There is no tenderness. There is no CVA tenderness.  Genitourinary: Rectum normal, vagina normal and uterus normal. There is no tenderness, lesion or injury on the right labia. There is no tenderness, lesion or injury on the left labia. Cervix exhibits no motion tenderness, no discharge and no friability. Right adnexum displays no mass, no tenderness and no fullness. Left adnexum displays no mass, no tenderness and no fullness.    Musculoskeletal: Normal range of motion.  Lymphadenopathy:    She has no cervical adenopathy.    She has no axillary adenopathy.  Neurological: She is alert and oriented to person, place, and time. She has normal reflexes. No cranial nerve deficit or sensory deficit.  Skin: Skin is warm, dry and intact. No rash noted.  Psychiatric: She has a normal mood and affect. Her speech is normal and behavior is normal. Thought content normal.  Nursing note and vitals reviewed.   BP 120/82 mmHg  Pulse 69  Resp 16  Ht 5\' 4"  (1.626 m)  Wt 156 lb 3.2 oz (70.852 kg)  BMI 26.80 kg/m2  SpO2 100%  LMP 08/22/2015 (Exact Date)  Assessment and Plan: 1. Annual physical exam Pt will check on Cologuard coverage and call if desired - CBC with Differential/Platelet - Comprehensive metabolic panel - Lipid panel - POCT urinalysis dipstick  2. Need for Tdap vaccination - Tdap vaccine greater than or equal to 7yo IM  3. Hx of abnormal cervical Pap smear Repeated today - exam essentially normal Recommend remaining off of OCPs - Pap IG and HPV  (high risk) DNA detection  4. 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  Dispense: 30 tablet; Refill: 5  5. OCD (obsessive compulsive disorder) - escitalopram (LEXAPRO) 10 MG tablet; Take 1 tablet (10 mg total) by mouth daily.  Dispense: 30 tablet; Refill: 5  6. Atypical facial pain - gabapentin (NEURONTIN) 300 MG capsule; Take 1 capsule (300 mg total) by mouth at bedtime.  Dispense: 30 capsule; Refill: 5  7. Insomnia - zolpidem (AMBIEN) 5 MG tablet; Take 1 tablet (5 mg total) by mouth at bedtime as needed for sleep.  Dispense: 30 tablet; Refill: 2 - traZODone (DESYREL) 50 MG tablet; Take 1 tablet (50 mg total) by mouth at bedtime as needed for sleep.  Dispense: 30 tablet; Refill: 5   Bari EdwardLaura Bryor Rami, MD [facility]  09/20/2015

## 2015-09-21 LAB — COMPREHENSIVE METABOLIC PANEL
A/G RATIO: 1.7 (ref 1.2–2.2)
ALBUMIN: 4.6 g/dL (ref 3.5–5.5)
ALK PHOS: 45 IU/L (ref 39–117)
ALT: 7 IU/L (ref 0–32)
AST: 16 IU/L (ref 0–40)
BUN / CREAT RATIO: 18 (ref 9–23)
BUN: 13 mg/dL (ref 6–24)
Bilirubin Total: 0.3 mg/dL (ref 0.0–1.2)
CHLORIDE: 100 mmol/L (ref 96–106)
CO2: 23 mmol/L (ref 18–29)
CREATININE: 0.73 mg/dL (ref 0.57–1.00)
Calcium: 9.4 mg/dL (ref 8.7–10.2)
GFR calc Af Amer: 111 mL/min/{1.73_m2} (ref >59)
GFR, EST NON AFRICAN AMERICAN: 96 mL/min/{1.73_m2} (ref >59)
GLOBULIN, TOTAL: 2.7 g/dL (ref 1.5–4.5)
Glucose: 96 mg/dL (ref 65–99)
POTASSIUM: 4.1 mmol/L (ref 3.5–5.2)
SODIUM: 140 mmol/L (ref 134–144)
Total Protein: 7.3 g/dL (ref 6.0–8.5)

## 2015-09-21 LAB — TSH: TSH: 1.07 u[IU]/mL (ref 0.450–4.500)

## 2015-09-21 LAB — LIPID PANEL
Chol/HDL Ratio: 3.1 ratio units (ref 0.0–4.4)
Cholesterol, Total: 289 mg/dL — ABNORMAL HIGH (ref 100–199)
HDL: 92 mg/dL (ref >39)
LDL Calculated: 172 mg/dL — ABNORMAL HIGH (ref 0–99)
Triglycerides: 126 mg/dL (ref 0–149)
VLDL Cholesterol Cal: 25 mg/dL (ref 5–40)

## 2015-09-21 LAB — CBC WITH DIFFERENTIAL/PLATELET
BASOS: 0 %
Basophils Absolute: 0 10*3/uL (ref 0.0–0.2)
EOS (ABSOLUTE): 0 10*3/uL (ref 0.0–0.4)
EOS: 1 %
HEMATOCRIT: 37.5 % (ref 34.0–46.6)
HEMOGLOBIN: 12.8 g/dL (ref 11.1–15.9)
Immature Grans (Abs): 0 10*3/uL (ref 0.0–0.1)
Immature Granulocytes: 0 %
LYMPHS ABS: 1.2 10*3/uL (ref 0.7–3.1)
Lymphs: 42 %
MCH: 36.3 pg — ABNORMAL HIGH (ref 26.6–33.0)
MCHC: 34.1 g/dL (ref 31.5–35.7)
MCV: 106 fL — AB (ref 79–97)
MONOCYTES: 17 %
MONOS ABS: 0.5 10*3/uL (ref 0.1–0.9)
NEUTROS ABS: 1.1 10*3/uL — AB (ref 1.4–7.0)
Neutrophils: 40 %
Platelets: 233 10*3/uL (ref 150–379)
RBC: 3.53 x10E6/uL — ABNORMAL LOW (ref 3.77–5.28)
RDW: 13 % (ref 12.3–15.4)
WBC: 2.8 10*3/uL — ABNORMAL LOW (ref 3.4–10.8)

## 2015-09-25 LAB — PAP IG AND HPV HIGH-RISK
HPV, high-risk: NEGATIVE
PAP SMEAR COMMENT: 0

## 2015-12-22 ENCOUNTER — Other Ambulatory Visit: Payer: Self-pay | Admitting: Internal Medicine

## 2015-12-22 DIAGNOSIS — F429 Obsessive-compulsive disorder, unspecified: Secondary | ICD-10-CM

## 2015-12-22 MED ORDER — ESCITALOPRAM OXALATE 10 MG PO TABS: 10.0000 mg | ORAL_TABLET | Freq: Every day | ORAL | 2 refills | Status: DC

## 2016-01-12 ENCOUNTER — Ambulatory Visit (INDEPENDENT_AMBULATORY_CARE_PROVIDER_SITE_OTHER): Payer: 59

## 2016-01-12 DIAGNOSIS — Z23 Encounter for immunization: Secondary | ICD-10-CM

## 2016-02-17 ENCOUNTER — Other Ambulatory Visit: Payer: Self-pay | Admitting: Internal Medicine

## 2016-02-17 DIAGNOSIS — E034 Atrophy of thyroid (acquired): Secondary | ICD-10-CM

## 2016-02-20 NOTE — Telephone Encounter (Signed)
pts coming in on 6/29 for her cpe

## 2016-04-17 ENCOUNTER — Other Ambulatory Visit: Payer: Self-pay | Admitting: Internal Medicine

## 2016-04-17 DIAGNOSIS — Z1231 Encounter for screening mammogram for malignant neoplasm of breast: Secondary | ICD-10-CM

## 2016-04-22 ENCOUNTER — Other Ambulatory Visit: Payer: Self-pay | Admitting: Internal Medicine

## 2016-04-22 DIAGNOSIS — G501 Atypical facial pain: Secondary | ICD-10-CM

## 2016-04-23 ENCOUNTER — Other Ambulatory Visit: Payer: Self-pay | Admitting: Internal Medicine

## 2016-04-23 DIAGNOSIS — G4700 Insomnia, unspecified: Secondary | ICD-10-CM

## 2016-04-24 ENCOUNTER — Ambulatory Visit
Admission: RE | Admit: 2016-04-24 | Discharge: 2016-04-24 | Disposition: A | Discharge status: Home / Self Care | Payer: 59 | Source: Ambulatory Visit | Attending: Internal Medicine | Admitting: Internal Medicine

## 2016-04-24 DIAGNOSIS — Z1231 Encounter for screening mammogram for malignant neoplasm of breast: Secondary | ICD-10-CM | POA: Diagnosis not present

## 2016-04-26 ENCOUNTER — Telehealth: Payer: Self-pay | Admitting: Internal Medicine

## 2016-04-26 NOTE — Telephone Encounter (Signed)
Both of these have already been refilled.

## 2016-04-26 NOTE — Telephone Encounter (Signed)
Pt called need refill for Rx. Gabapentin and Levothyroxine to [facility]

## 2016-04-29 NOTE — Telephone Encounter (Signed)
Called pt and left VM--medications been sent to [facility] and any question call the office back [phone]

## 2016-07-17 ENCOUNTER — Encounter: Payer: Self-pay | Admitting: Internal Medicine

## 2016-07-17 ENCOUNTER — Ambulatory Visit (INDEPENDENT_AMBULATORY_CARE_PROVIDER_SITE_OTHER): Payer: 59 | Admitting: Internal Medicine

## 2016-07-17 ENCOUNTER — Other Ambulatory Visit: Payer: Self-pay | Admitting: Internal Medicine

## 2016-07-17 ENCOUNTER — Ambulatory Visit: Payer: Managed Care, Other (non HMO) | Admitting: Internal Medicine

## 2016-07-17 VITALS — BP 122/90 | HR 74 | Ht 64.0 in | Wt 168.0 lb

## 2016-07-17 DIAGNOSIS — M25532 Pain in left wrist: Secondary | ICD-10-CM

## 2016-07-17 DIAGNOSIS — G501 Atypical facial pain: Secondary | ICD-10-CM | POA: Diagnosis not present

## 2016-07-17 DIAGNOSIS — E034 Atrophy of thyroid (acquired): Secondary | ICD-10-CM | POA: Diagnosis not present

## 2016-07-17 DIAGNOSIS — M25531 Pain in right wrist: Secondary | ICD-10-CM

## 2016-07-17 MED ORDER — GABAPENTIN 300 MG PO CAPS: 300.0000 mg | ORAL_CAPSULE | Freq: Two times a day (BID) | ORAL | 5 refills | Status: DC

## 2016-07-17 NOTE — Progress Notes (Signed)
Date:  07/17/2016   Name:  Robin Madden   DOB:  December 01, 1965   MRN:  [id number]   Chief Complaint: Hand Pain (Both hands having shooting pain. Can radiate up arms. Pt feels it is nerve pain. Pt does repetitive things at work with computer. "It hurts to drive." Started X 3 months ago. Gotten worse overtime. Tried wearing braces on both hands, and doesn't do anything. ) Wrist splints and advil do not help.  Might ease off after taking gabapentin in the evening.  Pain on back of hands - both the same.  Not tingling or numbness.  She has some weakness as well.  This has been progressive over the past 3 months.  Lab Results  Component Value Date   TSH 1.070 09/20/2015     Review of Systems  Constitutional: Negative for chills, fatigue and fever.  Respiratory: Negative for cough, chest tightness and shortness of breath.   Cardiovascular: Negative for chest pain and palpitations.  Gastrointestinal: Negative for abdominal pain.  Musculoskeletal: Positive for arthralgias, joint swelling and myalgias.  Neurological: Positive for weakness. Negative for dizziness and numbness.    Patient Active Problem List   Diagnosis Date Noted  . Hypothyroidism 04/04/2015  . OCD (obsessive compulsive disorder) 04/04/2015  . 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 Insomnia 04/04/2015  . Abnormal WBC count 04/04/2015  . Macrocytosis 04/04/2015  . Hx of abnormal cervical Pap smear 04/04/2015  . History of fracture of fibula 04/04/2015    Prior to Admission medications   Medication Sig Start Date End Date Taking? Authorizing Provider  escitalopram (LEXAPRO) 10 MG tablet Take 1 tablet (10 mg total) by mouth daily. 12/22/15  Yes Reubin Milan, MD  Folic Acid-Vit B6-Vit B12 (FOLBEE) 2.5-25-1 MG TABS tablet Take 1 tablet by mouth daily.   Yes Historical Provider, MD  gabapentin (NEURONTIN) 300 MG capsule TAKE 1 CAPSULE (300 MG TOTAL) BY MOUTH AT BEDTIME. 04/22/16  Yes Reubin Milan, MD  levothyroxine  (SYNTHROID, LEVOTHROID) 100 MCG tablet TAKE 1 TABLET (100 MCG TOTAL) BY MOUTH DAILY BEFORE BREAKFAST. 02/17/16  Yes Reubin Milan, MD  LORazepam (ATIVAN) 0.5 MG tablet Take 0.5 mg by mouth as needed for anxiety.   Yes Historical Provider, MD  traZODone (DESYREL) 50 MG tablet Take 1 tablet (50 mg total) by mouth at bedtime as needed for sleep. 09/20/15  Yes Reubin Milan, MD  zolpidem (AMBIEN) 5 MG tablet TAKE 1 TABLET BY MOUTH AT BEDTIME AS NEEDED FOR SLEEP 04/24/16  Yes Reubin Milan, MD    No Known Allergies  Past Surgical History:  Procedure Laterality Date  . TONSILLECTOMY      Social History  Substance Use Topics  . Smoking status: Never Smoker  . Smokeless tobacco: Never Used  . Alcohol use 0.0 oz/week     Comment: daily     Medication list has been reviewed and updated.   Physical Exam  Constitutional: She is oriented to person, place, and time. She appears well-developed. No distress.  HENT:  Head: Normocephalic and atraumatic.  Cardiovascular: Normal rate, regular rhythm and normal heart sounds.   Pulmonary/Chest: Effort normal. No respiratory distress.  Musculoskeletal: Normal range of motion.       Right wrist: She exhibits swelling. She exhibits normal range of motion and no tenderness.       Left wrist: She exhibits swelling. She exhibits normal range of motion and no tenderness.  Dorsum of both wrists and fingers of right  hand swollen; minimally tender Grip 4+/5 bilaterally  Neurological: She is alert and oriented to person, place, and time. She displays no tremor. No sensory deficit. She exhibits normal muscle tone.  Tinel's equivocally positive bilaterally Phalen's negative  Skin: Skin is warm and dry. No rash noted.  Psychiatric: She has a normal mood and affect. Her behavior is normal. Thought content normal.  Nursing note and vitals reviewed.   BP 122/90 (BP Location: Right Arm, Patient Position: Sitting, Cuff Size: Normal)   Pulse 74   Ht   (1.626 m)   Wt 168 lb (76.2 kg)   LMP 07/16/2016 Comment: last one was 05/18/2016 and then jumped to 07/16/2016  SpO2 98%   BMI 28.84 kg/m   Assessment and Plan: 1. Pain of both wrist joints Concern for inflammatory arthritis - if testing negative will refer to Neurology and NCS - ANA w/Reflex if Positive - Rheumatoid factor - Sedimentation rate  2. Hypothyroidism due to acquired atrophy of thyroid supplemented - TSH  3. Atypical facial pain Will increase gabapentin to bid - gabapentin (NEURONTIN) 300 MG capsule; Take 1 capsule (300 mg total) by mouth 2 (two) times daily.  Dispense: 60 capsule; Refill: 5   Meds ordered this encounter  Medications  . gabapentin (NEURONTIN) 300 MG capsule    Sig: Take 1 capsule (300 mg total) by mouth 2 (two) times daily.    Dispense:  60 capsule    Refill:  5    Bari Edward, MD [facility]  07/17/2016

## 2016-07-18 ENCOUNTER — Other Ambulatory Visit: Payer: Self-pay | Admitting: Internal Medicine

## 2016-07-18 DIAGNOSIS — M25531 Pain in right wrist: Secondary | ICD-10-CM

## 2016-07-18 DIAGNOSIS — M25532 Pain in left wrist: Principal | ICD-10-CM

## 2016-07-18 LAB — SEDIMENTATION RATE: Sed Rate: 2 mm/hr (ref 0–40)

## 2016-07-18 LAB — ANA W/REFLEX IF POSITIVE: Anti Nuclear Antibody(ANA): NEGATIVE

## 2016-07-18 LAB — RHEUMATOID FACTOR: Rhuematoid fact SerPl-aCnc: <10 IU/mL (ref 0.0–13.9)

## 2016-07-18 LAB — TSH: TSH: 0.616 u[IU]/mL (ref 0.450–4.500)

## 2016-07-28 ENCOUNTER — Other Ambulatory Visit: Payer: Self-pay | Admitting: Internal Medicine

## 2016-07-28 DIAGNOSIS — G4700 Insomnia, unspecified: Secondary | ICD-10-CM

## 2016-08-22 DIAGNOSIS — G5621 Lesion of ulnar nerve, right upper limb: Secondary | ICD-10-CM | POA: Insufficient documentation

## 2016-08-29 ENCOUNTER — Encounter: Payer: Self-pay | Admitting: *Deleted

## 2016-09-06 ENCOUNTER — Ambulatory Visit: Payer: Commercial Managed Care - HMO | Admitting: Anesthesiology

## 2016-09-06 ENCOUNTER — Encounter
Admission: RE | Disposition: A | Discharge status: Home / Self Care | Payer: Self-pay | Source: Ambulatory Visit | Attending: Unknown Physician Specialty

## 2016-09-06 ENCOUNTER — Ambulatory Visit
Admission: RE | Admit: 2016-09-06 | Discharge: 2016-09-06 | Disposition: A | Discharge status: Home / Self Care | Payer: Commercial Managed Care - HMO | Source: Ambulatory Visit | Attending: Unknown Physician Specialty | Admitting: Unknown Physician Specialty

## 2016-09-06 DIAGNOSIS — Z9889 Other specified postprocedural states: Secondary | ICD-10-CM | POA: Insufficient documentation

## 2016-09-06 DIAGNOSIS — E039 Hypothyroidism, unspecified: Secondary | ICD-10-CM | POA: Diagnosis not present

## 2016-09-06 DIAGNOSIS — G5623 Lesion of ulnar nerve, bilateral upper limbs: Secondary | ICD-10-CM | POA: Diagnosis present

## 2016-09-06 DIAGNOSIS — G4700 Insomnia, unspecified: Secondary | ICD-10-CM | POA: Diagnosis not present

## 2016-09-06 DIAGNOSIS — Z79899 Other long term (current) drug therapy: Secondary | ICD-10-CM | POA: Diagnosis not present

## 2016-09-06 DIAGNOSIS — Z791 Long term (current) use of non-steroidal anti-inflammatories (NSAID): Secondary | ICD-10-CM | POA: Diagnosis not present

## 2016-09-06 DIAGNOSIS — F429 Obsessive-compulsive disorder, unspecified: Secondary | ICD-10-CM | POA: Diagnosis not present

## 2016-09-06 HISTORY — DX: Disorder of white blood cells, unspecified: D72.9

## 2016-09-06 HISTORY — DX: Other complications of anesthesia, initial encounter: T88.59XA

## 2016-09-06 HISTORY — DX: Presence of spectacles and contact lenses: Z97.3

## 2016-09-06 HISTORY — PX: ULNAR COLLATERAL LIGAMENT REPAIR: SHX6159

## 2016-09-06 HISTORY — DX: Nausea with vomiting, unspecified: R11.2

## 2016-09-06 HISTORY — DX: Other specified postprocedural states: Z98.890

## 2016-09-06 HISTORY — DX: Adverse effect of unspecified anesthetic, initial encounter: T41.45XA

## 2016-09-06 HISTORY — DX: Atypical facial pain: G50.1

## 2016-09-06 SURGERY — REPAIR, LIGAMENT, ULNAR COLLATERAL
Anesthesia: General | Laterality: Right | Wound class: Clean

## 2016-09-06 MED ORDER — SCOPOLAMINE 1 MG/3DAYS TD PT72
1.0000 | MEDICATED_PATCH | Freq: Once | TRANSDERMAL | Status: DC
  Administered 2016-09-06: 1.5 mg via TRANSDERMAL

## 2016-09-06 MED ORDER — FENTANYL CITRATE (PF) 100 MCG/2ML IJ SOLN
INTRAMUSCULAR | Status: DC | PRN
  Administered 2016-09-06: 50 ug via INTRAVENOUS

## 2016-09-06 MED ORDER — NORCO 5-325 MG PO TABS: 1.0000 | ORAL_TABLET | Freq: Four times a day (QID) | ORAL | 0 refills | Status: DC | PRN

## 2016-09-06 MED ORDER — DEXAMETHASONE SODIUM PHOSPHATE 4 MG/ML IJ SOLN
INTRAMUSCULAR | Status: DC | PRN
  Administered 2016-09-06: 4 mg via INTRAVENOUS

## 2016-09-06 MED ORDER — ROPIVACAINE HCL 5 MG/ML IJ SOLN
INTRAMUSCULAR | Status: DC | PRN
  Administered 2016-09-06: 35 mL via PERINEURAL

## 2016-09-06 MED ORDER — ONDANSETRON HCL 4 MG/2ML IJ SOLN: 4.0000 mg | Freq: Once | INTRAMUSCULAR | Status: DC | PRN

## 2016-09-06 MED ORDER — ACETAMINOPHEN 160 MG/5ML PO SOLN: 325.0000 mg | ORAL | Status: DC | PRN

## 2016-09-06 MED ORDER — OXYCODONE HCL 5 MG/5ML PO SOLN: 5.0000 mg | Freq: Once | ORAL | Status: DC | PRN

## 2016-09-06 MED ORDER — FENTANYL CITRATE (PF) 100 MCG/2ML IJ SOLN: 25.0000 ug | INTRAMUSCULAR | Status: DC | PRN

## 2016-09-06 MED ORDER — MIDAZOLAM HCL 2 MG/2ML IJ SOLN
INTRAMUSCULAR | Status: DC | PRN
  Administered 2016-09-06: 1 mg via INTRAVENOUS
  Administered 2016-09-06: 2 mg via INTRAVENOUS

## 2016-09-06 MED ORDER — LACTATED RINGERS IV SOLN
INTRAVENOUS | Status: DC
  Administered 2016-09-06: 08:00:00 via INTRAVENOUS

## 2016-09-06 MED ORDER — ACETAMINOPHEN 325 MG PO TABS: 325.0000 mg | ORAL_TABLET | ORAL | Status: DC | PRN

## 2016-09-06 MED ORDER — LIDOCAINE HCL (CARDIAC) 20 MG/ML IV SOLN
INTRAVENOUS | Status: DC | PRN
  Administered 2016-09-06: 40 mg via INTRATRACHEAL

## 2016-09-06 MED ORDER — OXYCODONE HCL 5 MG PO TABS: 5.0000 mg | ORAL_TABLET | Freq: Once | ORAL | Status: DC | PRN

## 2016-09-06 MED ORDER — GLYCOPYRROLATE 0.2 MG/ML IJ SOLN
INTRAMUSCULAR | Status: DC | PRN
  Administered 2016-09-06: 0.1 mg via INTRAVENOUS

## 2016-09-06 MED ORDER — PROPOFOL 10 MG/ML IV BOLUS
INTRAVENOUS | Status: DC | PRN
  Administered 2016-09-06: 140 mg via INTRAVENOUS

## 2016-09-06 SURGICAL SUPPLY — 33 items
ADHESIVE MASTISOL STRL (MISCELLANEOUS) ×3 IMPLANT
BANDAGE ELASTIC 3 LF NS (GAUZE/BANDAGES/DRESSINGS) ×3 IMPLANT
BANDAGE ELASTIC 4 LF NS (GAUZE/BANDAGES/DRESSINGS) ×3 IMPLANT
BNDG COHESIVE 4X5 TAN STRL (GAUZE/BANDAGES/DRESSINGS) ×3 IMPLANT
BNDG ESMARK 4X12 TAN STRL LF (GAUZE/BANDAGES/DRESSINGS) ×3 IMPLANT
CANISTER SUCT 1200ML W/VALVE (MISCELLANEOUS) ×3 IMPLANT
CLOSURE WOUND 1/4X4 (GAUZE/BANDAGES/DRESSINGS) ×1
COVER LIGHT HANDLE UNIVERSAL (MISCELLANEOUS) ×6 IMPLANT
CUFF TOURN SGL QUICK 18 (TOURNIQUET CUFF) ×3 IMPLANT
DRAPE U-SHAPE 48X52 POLY STRL (PACKS) ×3 IMPLANT
DURAPREP 26ML APPLICATOR (WOUND CARE) ×3 IMPLANT
GAUZE SPONGE 4X4 12PLY STRL (GAUZE/BANDAGES/DRESSINGS) ×3 IMPLANT
GLOVE BIO SURGEON STRL SZ7.5 (GLOVE) ×3 IMPLANT
GLOVE INDICATOR 8.0 STRL GRN (GLOVE) ×3 IMPLANT
GOWN STRL REUS W/ TWL LRG LVL3 (GOWN DISPOSABLE) ×1 IMPLANT
GOWN STRL REUS W/ TWL XL LVL3 (GOWN DISPOSABLE) ×1 IMPLANT
GOWN STRL REUS W/TWL LRG LVL3 (GOWN DISPOSABLE) ×2
GOWN STRL REUS W/TWL XL LVL3 (GOWN DISPOSABLE) ×2
KIT ROOM TURNOVER OR (KITS) ×3 IMPLANT
LOOP VESSEL RED MINI 1.3X0.9 (MISCELLANEOUS) ×1 IMPLANT
LOOPS RED MINI 1.3MMX0.9MM (MISCELLANEOUS) ×2
NS IRRIG 500ML POUR BTL (IV SOLUTION) ×3 IMPLANT
PACK EXTREMITY ARMC (MISCELLANEOUS) ×3 IMPLANT
PAD GROUND ADULT SPLIT (MISCELLANEOUS) ×3 IMPLANT
SLING ARM LRG DEEP (SOFTGOODS) ×3 IMPLANT
SPLINT CAST 1 STEP 5X30 WHT (MISCELLANEOUS) ×3 IMPLANT
STAPLER SKIN PROX 35W (STAPLE) ×3 IMPLANT
STOCKINETTE IMPERVIOUS LG (DRAPES) ×3 IMPLANT
STRAP BODY AND KNEE 60X3 (MISCELLANEOUS) ×3 IMPLANT
STRIP CLOSURE SKIN 1/4X4 (GAUZE/BANDAGES/DRESSINGS) ×2 IMPLANT
SUT VIC AB 2-0 SH 27 (SUTURE) ×2
SUT VIC AB 2-0 SH 27XBRD (SUTURE) ×1 IMPLANT
SUT VICRYL+ 3-0 27IN RB-1 (SUTURE) ×3 IMPLANT

## 2016-09-06 NOTE — Anesthesia Postprocedure Evaluation (Signed)
Anesthesia Post Note  Patient: Robin Madden  Procedure(s) Performed: Procedure(s) (LRB): RIGHT ULNA NERVE TRANSPORTATION (Right)  Patient location during evaluation: PACU Anesthesia Type: General Level of consciousness: awake and alert and oriented Pain management: satisfactory to patient Vital Signs Assessment: post-procedure vital signs reviewed and stable Respiratory status: spontaneous breathing, nonlabored ventilation and respiratory function stable Cardiovascular status: blood pressure returned to baseline and stable Postop Assessment: Adequate PO intake and No signs of nausea or vomiting Anesthetic complications: no    Cherly BeachStella, Marilena Trevathan J

## 2016-09-06 NOTE — Anesthesia Procedure Notes (Signed)
Anesthesia Regional Block: Supraclavicular block   Pre-Anesthetic Checklist: ,, timeout performed, Correct Patient, Correct Site, Correct Laterality, Correct Procedure, Correct Position, site marked, Risks and benefits discussed,  Surgical consent,  Pre-op evaluation,  At surgeon's request and post-op pain management  Laterality: Right  Prep: chloraprep       Needles:  Injection technique: Single-shot  Needle Type: Echogenic Stimulator Needle      Needle Gauge: 21     Additional Needles:   Procedures: ultrasound guided, nerve stimulator,,,,,,  Narrative:  Start time: 09/06/2016 7:57 AM End time: 09/06/2016 8:04 AM Injection made incrementally with aspirations every 5 mL.  Performed by: Personally  Anesthesiologist: Ranee GosselinSTELLA, Fahim Kats  Additional Notes: Functioning IV was confirmed and monitors applied.  Sterile prep and drape,hand hygiene and sterile gloves were used.Ultrasound guidance: relevant anatomy identified, needle position confirmed, local anesthetic spread visualized around nerve(s)., vascular puncture avoided.  Image printed for medical record.  Negative aspiration and negative test dose prior to incremental administration of local anesthetic. The patient tolerated the procedure well. Vitals signes recorded in RN notes.

## 2016-09-06 NOTE — H&P (Signed)
  H and P reviewed. No changes. Uploaded at later date. 

## 2016-09-06 NOTE — Transfer of Care (Signed)
Immediate Anesthesia Transfer of Care Note  Patient: Robin Madden  Procedure(s) Performed: Procedure(s): RIGHT ULNA NERVE TRANSPORTATION (Right)  Patient Location: PACU  Anesthesia Type: General  Level of Consciousness: awake, alert  and patient cooperative  Airway and Oxygen Therapy: Patient Spontanous Breathing and Patient connected to supplemental oxygen  Post-op Assessment: Post-op Vital signs reviewed, Patient's Cardiovascular Status Stable, Respiratory Function Stable, Patent Airway and No signs of Nausea or vomiting  Post-op Vital Signs: Reviewed and stable  Complications: No apparent anesthesia complications

## 2016-09-06 NOTE — Op Note (Signed)
09/06/2016  1:03 PM  PATIENT:  Robin Madden  51 y.o. female  PRE-OPERATIVE DIAGNOSIS:  NEURITIS OF RIGHT ULNAR NERVE  POST-OPERATIVE DIAGNOSIS:  NEURITIS OF RIGHT ULNAR NERVE  PROCEDURE:  Procedure(s): RIGHT ULNA NERVE TRANSPORTATION (Right)  SURGEON:   Erin SonsHarold Stefanny Pieri, Montez HagemanJr., MD  ANESTHESIA: Supraclavicular block  IMPLANTS: None  HISTORY: Patient had a long history of bilateral ulnar neuropathy with the right being more symptomatic than the left. Neuropathy was confirmed with nerve conduction studies. Because the problem had been going on for about a year it was felt that ulnar nerve transposition on the right would be appropriate at this time. The  right ulnar nerve had been the most involved from a symptom standpoint.  OP NOTE: The patient had a supraclavicular block in the preop area. She was then taken to the operating room where a tourniquet was applied to the right upper arm and the right upper extremity was prepped and draped in usual fashion for a procedure about the elbow. The right upper extremity was then exsanguinated and the tourniquet was inflated. A slightly curved incision was made over the medial aspect of the right elbow. It was centered over the medial epicondyle. I bluntly dissected down through the subcutaneous tissue onto the cubital tunnel. I then divided the cubital tunnel and gradually dissected the ulnar nerve from the surrounding soft tissue. Care was taken to protect the muscular branches of the ulnar nerve.   The nerve was then transposed to the medial epicondyle and held in position with several 2-0 Vicryl sutures that brought the subcutaneous  tissue down to the medial medial epicondyle creating a subcutaneous tunnel for the nerve. The tourniquet was released at this time. Bleeding was controlled with coagulation cautery. The wound was irrigated with saline and then the subcutaneous tissue was closed with 2-0 Vicryl and the skin with skin staples.  Betadine was  applied to the wound followed by a sterile dressing and a fiberglass posterior splint that was applied with the elbow flexed about 90. The patient was then awakened and transferred to her stretcher bed. She was taken to the recovery room in satisfactory condition. The tourniquet was up for 25 minutes. Blood loss was negligible.

## 2016-09-06 NOTE — Discharge Instructions (Signed)
General Anesthesia, Adult, Care After These instructions provide you with information about caring for yourself after your procedure. Your health care provider may also give you more specific instructions. Your treatment has been planned according to current medical practices, but problems sometimes occur. Call your health care provider if you have any problems or questions after your procedure. What can I expect after the procedure? After the procedure, it is common to have:  Vomiting.  A sore throat.  Mental slowness.  It is common to feel:  Nauseous.  Cold or shivery.  Sleepy.  Tired.  Sore or achy, even in parts of your body where you did not have surgery.  Follow these instructions at home: For at least 24 hours after the procedure:  Do not: ? Participate in activities where you could fall or become injured. ? Drive. ? Use heavy machinery. ? Drink alcohol. ? Take sleeping pills or medicines that cause drowsiness. ? Make important decisions or sign legal documents. ? Take care of children on your own.  Rest. Eating and drinking  If you vomit, drink water, juice, or soup when you can drink without vomiting.  Drink enough fluid to keep your urine clear or pale yellow.  Make sure you have little or no nausea before eating solid foods.  Follow the diet recommended by your health care provider. General instructions  Have a responsible adult stay with you until you are awake and alert.  Return to your normal activities as told by your health care provider. Ask your health care provider what activities are safe for you.  Take over-the-counter and prescription medicines only as told by your health care provider.  If you smoke, do not smoke without supervision.  Keep all follow-up visits as told by your health care provider. This is important. Contact a health care provider if:  You continue to have nausea or vomiting at home, and medicines are not helpful.  You  cannot drink fluids or start eating again.  You cannot urinate after 8-12 hours.  You develop a skin rash.  You have fever.  You have increasing redness at the site of your procedure. Get help right away if:  You have difficulty breathing.  You have chest pain.  You have unexpected bleeding.  You feel that you are having a life-threatening or urgent problem. This information is not intended to replace advice given to you by your health care provider. Make sure you discuss any questions you have with your health care provider. Document Released: 06/17/2000 Document Revised: 08/14/2015 Document Reviewed: 02/23/2015 Elsevier Interactive Patient Education  2018 [employer]  Ice pack  Elevation  RTC in about 2 weeks  Use sling as needed

## 2016-09-06 NOTE — Anesthesia Procedure Notes (Signed)
Procedure Name: LMA Insertion Date/Time: 09/06/2016 8:57 AM Performed by: Jimmy PicketAMYOT, Parv Manthey Pre-anesthesia Checklist: Patient identified, Emergency Drugs available, Suction available, Timeout performed and Patient being monitored Patient Re-evaluated:Patient Re-evaluated prior to inductionOxygen Delivery Method: Circle system utilized Preoxygenation: Pre-oxygenation with 100% oxygen Intubation Type: IV induction LMA: LMA inserted LMA Size: 4.0 Number of attempts: 1 Placement Confirmation: positive ETCO2 and breath sounds checked- equal and bilateral Tube secured with: Tape

## 2016-09-06 NOTE — Anesthesia Preprocedure Evaluation (Signed)
Anesthesia Evaluation  Patient identified by MRN, date of birth, ID band Patient awake    Reviewed: Allergy & Precautions, H&P , NPO status , Patient's Chart, lab work & pertinent test results  History of Anesthesia Complications (+) PONV and history of anesthetic complications  Airway Mallampati: II  TM Distance: >3 FB Neck ROM: full    Dental no notable dental hx.    Pulmonary    Pulmonary exam normal        Cardiovascular Normal cardiovascular exam     Neuro/Psych PSYCHIATRIC DISORDERS    GI/Hepatic   Endo/Other  Hypothyroidism   Renal/GU      Musculoskeletal   Abdominal   Peds  Hematology   Anesthesia Other Findings   Reproductive/Obstetrics                             Anesthesia Physical Anesthesia Plan  ASA: II  Anesthesia Plan: General   Post-op Pain Management:  Regional for Post-op pain and GA combined w/ Regional for post-op pain   Induction:   PONV Risk Score and Plan: 4 or greater and Ondansetron, Dexamethasone, Propofol, Midazolam and Scopolamine patch - Pre-op  Airway Management Planned:   Additional Equipment:   Intra-op Plan:   Post-operative Plan:   Informed Consent: I have reviewed the patients History and Physical, chart, labs and discussed the procedure including the risks, benefits and alternatives for the proposed anesthesia with the patient or authorized representative who has indicated his/her understanding and acceptance.     Plan Discussed with:   Anesthesia Plan Comments:         Anesthesia Quick Evaluation

## 2016-09-09 ENCOUNTER — Encounter: Payer: Self-pay | Admitting: Unknown Physician Specialty

## 2016-09-15 ENCOUNTER — Other Ambulatory Visit: Payer: Self-pay | Admitting: Internal Medicine

## 2016-09-15 DIAGNOSIS — F429 Obsessive-compulsive disorder, unspecified: Secondary | ICD-10-CM

## 2016-09-20 ENCOUNTER — Encounter: Payer: Self-pay | Admitting: Internal Medicine

## 2016-09-20 ENCOUNTER — Ambulatory Visit (INDEPENDENT_AMBULATORY_CARE_PROVIDER_SITE_OTHER): Payer: 59 | Admitting: Internal Medicine

## 2016-09-20 VITALS — BP 112/72 | HR 69 | Ht 64.0 in | Wt 174.0 lb

## 2016-09-20 DIAGNOSIS — Z0000 Encounter for general adult medical examination without abnormal findings: Secondary | ICD-10-CM | POA: Diagnosis not present

## 2016-09-20 DIAGNOSIS — E034 Atrophy of thyroid (acquired): Secondary | ICD-10-CM

## 2016-09-20 DIAGNOSIS — Z1211 Encounter for screening for malignant neoplasm of colon: Secondary | ICD-10-CM

## 2016-09-20 DIAGNOSIS — G5603 Carpal tunnel syndrome, bilateral upper limbs: Secondary | ICD-10-CM

## 2016-09-20 DIAGNOSIS — G5601 Carpal tunnel syndrome, right upper limb: Secondary | ICD-10-CM | POA: Insufficient documentation

## 2016-09-20 DIAGNOSIS — F422 Mixed obsessional thoughts and acts: Secondary | ICD-10-CM

## 2016-09-20 DIAGNOSIS — G5602 Carpal tunnel syndrome, left upper limb: Secondary | ICD-10-CM | POA: Insufficient documentation

## 2016-09-20 LAB — POCT URINALYSIS DIPSTICK
Bilirubin, UA: NEGATIVE
Glucose, UA: NEGATIVE
Ketones, UA: NEGATIVE
LEUKOCYTES UA: NEGATIVE
NITRITE UA: NEGATIVE
PH UA: 5 (ref 5.0–8.0)
PROTEIN UA: NEGATIVE
RBC UA: NEGATIVE
Spec Grav, UA: 1.02 (ref 1.010–1.025)
Urobilinogen, UA: 0.2 E.U./dL

## 2016-09-20 NOTE — Patient Instructions (Signed)

## 2016-09-20 NOTE — Progress Notes (Signed)
Date:  09/20/2016   Name:  Robin M Jane   DOB:  1965/11/14   MRN:  [id number]   Chief Complaint: Annual Exam (Breast Exam. ) Robin Madden is a 51 y.o. female who presents today for her Complete Annual Exam. She feels poorly due to joint pain and swelling. She reports exercising rarely due to recent ulnar nerve surgery. She reports she is sleeping well. Denies breast problems - recent mammogram was normal.  Still having menstrual bleeding intermittently - recently skipped 2 months but had regular cycle last month. Pap last year was negative with negative HPV. She has not had a colonoscopy - verified that cologuard is covered.  Thyroid Problem  Presents for follow-up visit. Symptoms include menstrual problem (getting irregular) and weight gain. Patient reports no anxiety, constipation, diarrhea, fatigue, heat intolerance, leg swelling, palpitations or tremors. The symptoms have been stable.   Arthralgias - waiting for rheumatologic evaluation.  Has bilat CTS, recent right ulnar nerve transposition surgery.  Now having more foot and ankle pain, swelling in both feet and ankles that comes and goes.  Review of Systems  Constitutional: Positive for weight gain. Negative for chills, fatigue and fever.  HENT: Negative for congestion, hearing loss, tinnitus, trouble swallowing and voice change.   Eyes: Negative for visual disturbance.  Respiratory: Negative for cough, chest tightness, shortness of breath and wheezing.   Cardiovascular: Negative for chest pain, palpitations and leg swelling.  Gastrointestinal: Negative for abdominal pain, constipation, diarrhea and vomiting.  Endocrine: Negative for heat intolerance, polydipsia and polyuria.  Genitourinary: Positive for menstrual problem (getting irregular). Negative for dysuria, frequency and genital sores.  Musculoskeletal: Positive for arthralgias. Negative for gait problem. Joint swelling: wrists and ankles.  Skin: Negative for  color change and rash.  Neurological: Negative for dizziness, tremors, light-headedness and headaches.  Hematological: Negative for adenopathy. Does not bruise/bleed easily.  Psychiatric/Behavioral: Negative for dysphoric mood and sleep disturbance. The patient is not nervous/anxious.     Patient Active Problem List   Diagnosis Date Noted  . Bilateral wrist pain 07/18/2016  . Hypothyroidism 04/04/2015  . OCD (obsessive compulsive disorder) 04/04/2015  . 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 Insomnia 04/04/2015  . Abnormal WBC count 04/04/2015  . Macrocytosis 04/04/2015  . Hx of abnormal cervical Pap smear 04/04/2015  . History of fracture of fibula 04/04/2015    Prior to Admission medications   Medication Sig Start Date End Date Taking? Authorizing Provider  5-Hydroxytryptophan (5-HTP PO) Take by mouth daily.   Yes [provider]  escitalopram (LEXAPRO) 10 MG tablet TAKE 1 TABLET (10 MG TOTAL) BY MOUTH DAILY. 09/15/16  Yes Reubin Milan, MD  Folic Acid-Vit B6-Vit B12 (FOLBEE) 2.5-25-1 MG TABS tablet Take 1 tablet by mouth daily.   Yes [provider]  gabapentin (NEURONTIN) 300 MG capsule Take 1 capsule (300 mg total) by mouth 2 (two) times daily. 07/17/16  Yes Reubin Milan, MD  levothyroxine (SYNTHROID, LEVOTHROID) 100 MCG tablet TAKE 1 TABLET (100 MCG TOTAL) BY MOUTH DAILY BEFORE BREAKFAST. 02/17/16  Yes Reubin Milan, MD  LORazepam (ATIVAN) 0.5 MG tablet Take 0.5 mg by mouth as needed for anxiety.   Yes [provider]  Probiotic Product (PROBIOTIC DAILY PO) Take by mouth.   Yes [provider]  zolpidem (AMBIEN) 5 MG tablet TAKE 1 TABLET BY MOUTH AT BEDTIME AS NEEDED FOR SLEEP 04/24/16  Yes Reubin Milan, MD    No Known Allergies  Past Surgical History:  Procedure Laterality Date  . TONSILLECTOMY    . ULNAR COLLATERAL LIGAMENT REPAIR Right 09/06/2016   Procedure: RIGHT ULNA NERVE TRANSPORTATION;  Surgeon: Erin SonsKernodle, Harold, MD;   [facility];  Service: Orthopedics;  Laterality: Right;    Social History  Substance Use Topics  . Smoking status: Never Smoker  . Smokeless tobacco: Never Used  . Alcohol use 8.4 oz/week    7 Glasses of wine, 7 Shots of liquor per week     Comment: daily     Medication list has been reviewed and updated.   Physical Exam  Constitutional: She is oriented to person, place, and time. She appears well-developed and well-nourished. No distress.  HENT:  Head: Normocephalic and atraumatic.  Right Ear: Tympanic membrane and ear canal normal.  Left Ear: Tympanic membrane and ear canal normal.  Nose: Right sinus exhibits no maxillary sinus tenderness. Left sinus exhibits no maxillary sinus tenderness.  Mouth/Throat: Uvula is midline and oropharynx is clear and moist.  Eyes: Conjunctivae and EOM are normal. Right eye exhibits no discharge. Left eye exhibits no discharge. No scleral icterus.  Neck: Normal range of motion. Carotid bruit is not present. No erythema present. No thyromegaly present.  Cardiovascular: Normal rate, regular rhythm, normal heart sounds and normal pulses.   Pulmonary/Chest: Effort normal. No respiratory distress. She has no wheezes. Right breast exhibits tenderness. Right breast exhibits no mass, no nipple discharge and no skin change. Left breast exhibits tenderness. Left breast exhibits no mass, no nipple discharge and no skin change.  Abdominal: Soft. Bowel sounds are normal. There is no hepatosplenomegaly. There is no tenderness. There is no CVA tenderness.  Musculoskeletal: She exhibits no edema.       Arms: Mild tenderness and synovitis of both wrists.  Lymphadenopathy:    She has no cervical adenopathy.    She has no axillary adenopathy.  Neurological: She is alert and oriented to person, place, and time. She has normal strength and normal reflexes. No cranial nerve deficit or sensory deficit.  Skin: Skin is warm, dry and intact. No rash noted.   Psychiatric: She has a normal mood and affect. Her speech is normal and behavior is normal. Thought content normal.  Nursing note and vitals reviewed.   BP 112/72   Pulse 69   Ht 5\' 4"  (1.626 m)   Wt 174 lb (78.9 kg)   LMP 08/22/2016 Comment: preg test negative  SpO2 96%   BMI 29.87 kg/m   Assessment and Plan: 1. Annual physical exam Pap and Mammogram current - Comprehensive metabolic panel - CBC with Differential/Platelet - Lipid panel - POCT urinalysis dipstick  2. Hypothyroidism due to acquired atrophy of thyroid Supplemented Recent TSH normal  3. Mixed obsessional thoughts and acts Continue medications  4. Bilateral carpal tunnel syndrome Being followed by Orthopedics  5. Colon cancer screening - Cologuard   No orders of the defined types were placed in this encounter.   Bari EdwardLaura Ines Warf, MD [facility]  09/20/2016

## 2016-09-21 LAB — CBC WITH DIFFERENTIAL/PLATELET
BASOS ABS: 0 10*3/uL (ref 0.0–0.2)
Basos: 0 %
EOS (ABSOLUTE): 0.1 10*3/uL (ref 0.0–0.4)
Eos: 2 %
Hematocrit: 37.8 % (ref 34.0–46.6)
Hemoglobin: 12.3 g/dL (ref 11.1–15.9)
IMMATURE GRANS (ABS): 0 10*3/uL (ref 0.0–0.1)
Immature Granulocytes: 0 %
LYMPHS: 39 %
Lymphocytes Absolute: 1 10*3/uL (ref 0.7–3.1)
MCH: 34.8 pg — AB (ref 26.6–33.0)
MCHC: 32.5 g/dL (ref 31.5–35.7)
MCV: 107 fL — ABNORMAL HIGH (ref 79–97)
MONOS ABS: 0.3 10*3/uL (ref 0.1–0.9)
Monocytes: 13 %
NEUTROS ABS: 1.2 10*3/uL — AB (ref 1.4–7.0)
Neutrophils: 46 %
PLATELETS: 218 10*3/uL (ref 150–379)
RBC: 3.53 x10E6/uL — ABNORMAL LOW (ref 3.77–5.28)
RDW: 14.1 % (ref 12.3–15.4)
WBC: 2.7 10*3/uL — ABNORMAL LOW (ref 3.4–10.8)

## 2016-09-21 LAB — LIPID PANEL
CHOLESTEROL TOTAL: 334 mg/dL — AB (ref 100–199)
Chol/HDL Ratio: 3.7 ratio (ref 0.0–4.4)
HDL: 90 mg/dL (ref >39)
LDL Calculated: 225 mg/dL — ABNORMAL HIGH (ref 0–99)
Triglycerides: 97 mg/dL (ref 0–149)
VLDL CHOLESTEROL CAL: 19 mg/dL (ref 5–40)

## 2016-09-21 LAB — COMPREHENSIVE METABOLIC PANEL
A/G RATIO: 1.9 (ref 1.2–2.2)
ALT: 31 IU/L (ref 0–32)
AST: 39 IU/L (ref 0–40)
Albumin: 4.6 g/dL (ref 3.5–5.5)
Alkaline Phosphatase: 53 IU/L (ref 39–117)
BILIRUBIN TOTAL: 0.5 mg/dL (ref 0.0–1.2)
BUN/Creatinine Ratio: 18 (ref 9–23)
BUN: 12 mg/dL (ref 6–24)
CHLORIDE: 101 mmol/L (ref 96–106)
CO2: 22 mmol/L (ref 20–29)
Calcium: 9.1 mg/dL (ref 8.7–10.2)
Creatinine, Ser: 0.68 mg/dL (ref 0.57–1.00)
GFR calc non Af Amer: 102 mL/min/{1.73_m2} (ref >59)
GFR, EST AFRICAN AMERICAN: 117 mL/min/{1.73_m2} (ref >59)
Globulin, Total: 2.4 g/dL (ref 1.5–4.5)
Glucose: 85 mg/dL (ref 65–99)
POTASSIUM: 4.1 mmol/L (ref 3.5–5.2)
SODIUM: 140 mmol/L (ref 134–144)
TOTAL PROTEIN: 7 g/dL (ref 6.0–8.5)

## 2016-11-03 ENCOUNTER — Other Ambulatory Visit: Payer: Self-pay | Admitting: Internal Medicine

## 2016-11-03 DIAGNOSIS — G4700 Insomnia, unspecified: Secondary | ICD-10-CM

## 2017-01-18 ENCOUNTER — Encounter: Payer: Self-pay | Admitting: Gynecology

## 2017-01-18 ENCOUNTER — Ambulatory Visit (INDEPENDENT_AMBULATORY_CARE_PROVIDER_SITE_OTHER): Payer: 59

## 2017-01-18 ENCOUNTER — Ambulatory Visit
Admission: EM | Admit: 2017-01-18 | Discharge: 2017-01-18 | Disposition: A | Discharge status: Home / Self Care | Payer: 59 | Attending: Family Medicine | Admitting: Family Medicine

## 2017-01-18 DIAGNOSIS — S92352A Displaced fracture of fifth metatarsal bone, left foot, initial encounter for closed fracture: Secondary | ICD-10-CM | POA: Diagnosis not present

## 2017-01-18 DIAGNOSIS — S8262XA Displaced fracture of lateral malleolus of left fibula, initial encounter for closed fracture: Secondary | ICD-10-CM

## 2017-01-18 DIAGNOSIS — W108XXA Fall (on) (from) other stairs and steps, initial encounter: Secondary | ICD-10-CM

## 2017-01-18 MED ORDER — HYDROCODONE-ACETAMINOPHEN 5-325 MG PO TABS: ORAL_TABLET | ORAL | 0 refills | Status: DC

## 2017-01-18 NOTE — ED Provider Notes (Signed)
[facility]    CSN: [id number] Arrival date & time: 01/18/17  1117     History   Chief Complaint Chief Complaint  Patient presents with  . Foot Injury    HPI Robin Madden is a 51 y.o. female.   The history is provided by the patient.  Foot Injury  Location:  Ankle and foot Time since incident:  12 hours Injury: yes   Mechanism of injury: fall   Fall:    Fall occurred:  Down stairs   Impact surface:  Water quality scientistCarpet   Point of impact:  Feet   Entrapped after fall: no   Ankle location:  L ankle Foot location:  L foot Pain details:    Quality:  Aching Chronicity:  New Dislocation: no   Foreign body present:  No foreign bodies Prior injury to area:  Yes Relieved by:  None tried Worsened by:  Bearing weight Ineffective treatments:  None tried Associated symptoms: swelling   Associated symptoms: no back pain, no decreased ROM, no fatigue, no fever, no itching, no muscle weakness, no neck pain, no numbness, no stiffness and no tingling     Past Medical History:  Diagnosis Date  . Abnormal WBC count    "has been that way forever, with no explanation" (low)  . Anxiety   . Atypical facial pain    right side  . Complication of anesthesia   . Hypothyroid   . OCD (obsessive compulsive disorder)   . PONV (postoperative nausea and vomiting)    after tonsillectomy  . Wears contact lenses     Patient Active Problem List   Diagnosis Date Noted  . CTS (carpal tunnel syndrome) 09/20/2016  . 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 Bilateral wrist pain 07/18/2016  . Hypothyroidism 04/04/2015  . OCD (obsessive compulsive disorder) 04/04/2015  . 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 Insomnia 04/04/2015  . Abnormal WBC count 04/04/2015  . Macrocytosis 04/04/2015  . Hx of abnormal cervical Pap smear 04/04/2015  . History of fracture of fibula 04/04/2015    Past Surgical History:  Procedure Laterality Date  . TONSILLECTOMY    . ULNAR COLLATERAL LIGAMENT  REPAIR Right 09/06/2016   Procedure: RIGHT ULNA NERVE TRANSPORTATION;  Surgeon: Erin SonsKernodle, Harold, MD;  [facility];  Service: Orthopedics;  Laterality: Right;    OB History    No data available       Home Medications    Prior to Admission medications   Medication Sig Start Date End Date Taking? Authorizing Provider  escitalopram (LEXAPRO) 10 MG tablet TAKE 1 TABLET (10 MG TOTAL) BY MOUTH DAILY. 09/15/16  Yes Reubin MilanBerglund, Laura H, MD  Folic Acid-Vit B6-Vit B12 (FOLBEE) 2.5-25-1 MG TABS tablet Take 1 tablet by mouth daily.   Yes [provider]  gabapentin (NEURONTIN) 300 MG capsule Take 1 capsule (300 mg total) by mouth 2 (two) times daily. 07/17/16  Yes Reubin MilanBerglund, Laura H, MD  levothyroxine (SYNTHROID, LEVOTHROID) 100 MCG tablet TAKE 1 TABLET (100 MCG TOTAL) BY MOUTH DAILY BEFORE BREAKFAST. 02/17/16  Yes Reubin MilanBerglund, Laura H, MD  linagliptin (TRADJENTA) 5 MG TABS tablet Take 5 mg by mouth daily.   Yes [provider]  LORazepam (ATIVAN) 0.5 MG tablet Take 0.5 mg by mouth as needed for anxiety.   Yes [provider]  Probiotic Product (PROBIOTIC DAILY PO) Take by mouth.   Yes [provider]  traZODone (DESYREL) 100 MG tablet Take 100 mg by mouth at bedtime.  Yes [provider]  zolpidem (AMBIEN) 5 MG tablet TAKE 1 TABLET BY MOUTH AT BEDTIME AS NEEDED FOR SLEEP 11/04/16  Yes Reubin Milan, MD  5-Hydroxytryptophan (5-HTP PO) Take by mouth daily.    [provider]  HYDROcodone-acetaminophen (NORCO/VICODIN) 5-325 MG tablet 1-2 tabs po q 8 hours prn 01/18/17   Payton Mccallum, MD    Family History Family History  Problem Relation Age of Onset  . Mental illness Mother   . Breast cancer Neg Hx     Social History Social History  Substance Use Topics  . Smoking status: Never Smoker  . Smokeless tobacco: Never Used  . Alcohol use 8.4 oz/week    7 Glasses of wine, 7 Shots of liquor per week     Comment: daily      Allergies   Patient has no known allergies.   Review of Systems Review of Systems  Constitutional: Negative for fatigue and fever.  Musculoskeletal: Negative for back pain, neck pain and stiffness.  Skin: Negative for itching.     Physical Exam Triage Vital Signs ED Triage Vitals  Enc Vitals Group     BP 01/18/17 1137 (!) 142/81     Pulse Rate 01/18/17 1137 84     Resp 01/18/17 1137 16     Temp 01/18/17 1137 98 F (36.7 C)     Temp Source 01/18/17 1137 Oral     SpO2 01/18/17 1137 99 %     Weight 01/18/17 1133 170 lb (77.1 kg)     Height 01/18/17 1133 5\' 3"  (1.6 m)     Head Circumference --      Peak Flow --      Pain Score 01/18/17 1133 10     Pain Loc --      Pain Edu? --      Excl. in GC? --    No data found.   Updated Vital Signs BP (!) 142/81 (BP Location: Left Arm)   Pulse 84   Temp 98 F (36.7 C) (Oral)   Resp 16   Ht 5\' 3"  (1.6 m)   Wt 170 lb (77.1 kg)   SpO2 99%   BMI 30.11 kg/m   Visual Acuity Right Eye Distance:   Left Eye Distance:   Bilateral Distance:    Right Eye Near:   Left Eye Near:    Bilateral Near:     Physical Exam  Constitutional: She appears well-developed and well-nourished. No distress.  Musculoskeletal:       Left ankle: She exhibits swelling and ecchymosis. She exhibits normal range of motion, no deformity, no laceration and normal pulse. Tenderness. Lateral malleolus, AITFL and head of 5th metatarsal tenderness found. No medial malleolus, no CF ligament and no posterior TFL tenderness found. Achilles tendon normal.  Skin: She is not diaphoretic.  Nursing note and vitals reviewed.    UC Treatments / Results  Labs (all labs ordered are listed, but only abnormal results are displayed) Labs Reviewed - No data to display  EKG  EKG Interpretation None       Radiology Dg Ankle Complete Left  Result Date: 01/18/2017 CLINICAL DATA:  Left foot and ankle pain after fall. EXAM: LEFT ANKLE COMPLETE - 3+ VIEW  COMPARISON:  None. FINDINGS: Nondisplaced fracture within the lateral malleolus, likely acute. Distal tibia appears intact and normally aligned. Ankle mortise is symmetric. Slightly displaced fracture at the base of the fifth metatarsal bone. IMPRESSION: 1. Nondisplaced fracture within the lateral malleolus. Overlying soft  tissue swelling. 2. Slightly displaced fracture at the base of the fifth metatarsal bone, avulsion fracture versus Jones fracture, favor avulsion fracture based on location. Electronically Signed   By: Bary Richard M.D.   On: 01/18/2017 12:14   Dg Foot Complete Left  Result Date: 01/18/2017 CLINICAL DATA:  Pain after fall. EXAM: LEFT FOOT - COMPLETE 3+ VIEW COMPARISON:  None. FINDINGS: Slightly displaced fracture at the base of the fifth metatarsal bone. Alignment at the adjacent TMT joint appears normal. No additional osseous abnormality. Soft tissues about the left foot are unremarkable. IMPRESSION: Slightly displaced fracture at the base of the fifth metatarsal bone, avulsion fracture versus Jones fracture, favor avulsion fracture based on location. Electronically Signed   By: Bary Richard M.D.   On: 01/18/2017 12:12    Procedures Procedures (including critical care time)  Medications Ordered in UC Medications - No data to display   Initial Impression / Assessment and Plan / UC Course  I have reviewed the triage vital signs and the nursing notes.  Pertinent labs & imaging results that were available during my care of the patient were reviewed by me and considered in my medical decision making (see chart for details).       Final Clinical Impressions(s) / UC Diagnoses   Final diagnoses:  Closed fracture of base of fifth metatarsal bone of left foot, initial encounter  Closed low lateral malleolus fracture, left, initial encounter    New Prescriptions Discharge Medication List as of 01/18/2017 12:23 PM    START taking these medications   Details   HYDROcodone-acetaminophen (NORCO/VICODIN) 5-325 MG tablet 1-2 tabs po q 8 hours prn, Print       1. x-ray results and diagnosis reviewed with patient 2. Immobilized and non-weightbearing with cast boot (patient had one from last year's injury) and crutches (patient has at home) 3.  rx as per orders above; reviewed possible side effects, interactions, risks and benefits  4. Recommend supportive treatment with rest, ice, elevation 5. Follow-up with orthopedist early next week  Controlled Substance Prescriptions [place] Controlled Substance Registry consulted? Not Applicable   Payton Mccallum, MD 01/18/17 1251

## 2017-01-18 NOTE — Discharge Instructions (Signed)
Rest, ice, elevation, boot, crutches Follow up with foot orthopedist early next week

## 2017-01-18 NOTE — ED Triage Notes (Signed)
Per patient twisted her left ankle x last pm while going down stairs .

## 2017-02-22 HISTORY — PX: CARPAL TUNNEL RELEASE: SHX101

## 2017-02-28 ENCOUNTER — Other Ambulatory Visit: Payer: Self-pay | Admitting: Internal Medicine

## 2017-02-28 DIAGNOSIS — E034 Atrophy of thyroid (acquired): Secondary | ICD-10-CM

## 2017-02-28 NOTE — Telephone Encounter (Signed)
lvm to set up appt

## 2017-03-30 ENCOUNTER — Other Ambulatory Visit: Payer: Self-pay | Admitting: Internal Medicine

## 2017-03-30 DIAGNOSIS — G4700 Insomnia, unspecified: Secondary | ICD-10-CM

## 2017-04-01 ENCOUNTER — Other Ambulatory Visit: Payer: Self-pay | Admitting: Internal Medicine

## 2017-04-01 DIAGNOSIS — G4700 Insomnia, unspecified: Secondary | ICD-10-CM

## 2017-04-14 ENCOUNTER — Other Ambulatory Visit: Payer: Self-pay | Admitting: Internal Medicine

## 2017-04-14 ENCOUNTER — Encounter: Payer: Self-pay | Admitting: Internal Medicine

## 2017-04-14 DIAGNOSIS — Z1231 Encounter for screening mammogram for malignant neoplasm of breast: Secondary | ICD-10-CM

## 2017-04-17 ENCOUNTER — Encounter: Payer: Self-pay | Admitting: Internal Medicine

## 2017-04-17 ENCOUNTER — Other Ambulatory Visit: Payer: Self-pay | Admitting: Internal Medicine

## 2017-04-17 DIAGNOSIS — G501 Atypical facial pain: Secondary | ICD-10-CM

## 2017-04-17 NOTE — Telephone Encounter (Signed)
Patient requesting referral. She has been seen for this in the past. Please Advise.

## 2017-04-21 ENCOUNTER — Encounter: Payer: Self-pay | Admitting: Internal Medicine

## 2017-05-05 ENCOUNTER — Ambulatory Visit: Payer: 59

## 2017-05-05 ENCOUNTER — Ambulatory Visit
Admission: RE | Admit: 2017-05-05 | Discharge: 2017-05-05 | Disposition: A | Discharge status: Home / Self Care | Payer: 59 | Source: Ambulatory Visit | Attending: Internal Medicine | Admitting: Internal Medicine

## 2017-05-05 DIAGNOSIS — Z1231 Encounter for screening mammogram for malignant neoplasm of breast: Secondary | ICD-10-CM

## 2017-05-05 DIAGNOSIS — N6320 Unspecified lump in the left breast, unspecified quadrant: Secondary | ICD-10-CM | POA: Diagnosis not present

## 2017-05-05 DIAGNOSIS — R928 Other abnormal and inconclusive findings on diagnostic imaging of breast: Secondary | ICD-10-CM | POA: Insufficient documentation

## 2017-05-06 ENCOUNTER — Other Ambulatory Visit: Payer: Self-pay | Admitting: Internal Medicine

## 2017-05-06 DIAGNOSIS — R928 Other abnormal and inconclusive findings on diagnostic imaging of breast: Secondary | ICD-10-CM

## 2017-05-06 DIAGNOSIS — N6320 Unspecified lump in the left breast, unspecified quadrant: Secondary | ICD-10-CM

## 2017-05-07 ENCOUNTER — Ambulatory Visit
Admission: RE | Admit: 2017-05-07 | Discharge: 2017-05-07 | Disposition: A | Discharge status: Home / Self Care | Payer: 59 | Source: Ambulatory Visit | Attending: Internal Medicine | Admitting: Internal Medicine

## 2017-05-07 DIAGNOSIS — N6320 Unspecified lump in the left breast, unspecified quadrant: Secondary | ICD-10-CM | POA: Diagnosis present

## 2017-05-07 DIAGNOSIS — R928 Other abnormal and inconclusive findings on diagnostic imaging of breast: Secondary | ICD-10-CM | POA: Insufficient documentation

## 2017-05-07 DIAGNOSIS — N6002 Solitary cyst of left breast: Secondary | ICD-10-CM | POA: Diagnosis not present

## 2017-05-09 ENCOUNTER — Other Ambulatory Visit: Payer: Self-pay | Admitting: Neurology

## 2017-05-09 ENCOUNTER — Ambulatory Visit: Payer: 59

## 2017-05-09 DIAGNOSIS — R51 Headache: Principal | ICD-10-CM

## 2017-05-09 DIAGNOSIS — R519 Headache, unspecified: Secondary | ICD-10-CM

## 2017-05-20 ENCOUNTER — Ambulatory Visit
Admission: RE | Admit: 2017-05-20 | Discharge: 2017-05-20 | Disposition: A | Discharge status: Home / Self Care | Payer: Commercial Managed Care - HMO | Source: Ambulatory Visit | Attending: Neurology | Admitting: Neurology

## 2017-05-20 DIAGNOSIS — R51 Headache: Secondary | ICD-10-CM | POA: Diagnosis not present

## 2017-05-20 DIAGNOSIS — R519 Headache, unspecified: Secondary | ICD-10-CM

## 2017-05-20 MED ORDER — GADOBENATE DIMEGLUMINE 529 MG/ML IV SOLN
16.0000 mL | Freq: Once | INTRAVENOUS | Status: AC | PRN
  Administered 2017-05-20: 16 mL via INTRAVENOUS

## 2017-05-21 ENCOUNTER — Encounter (HOSPITAL_COMMUNITY): Payer: Self-pay

## 2017-05-21 ENCOUNTER — Ambulatory Visit (HOSPITAL_COMMUNITY): Payer: 59

## 2017-05-25 ENCOUNTER — Other Ambulatory Visit: Payer: Self-pay | Admitting: Internal Medicine

## 2017-05-25 DIAGNOSIS — G501 Atypical facial pain: Secondary | ICD-10-CM

## 2017-07-24 ENCOUNTER — Other Ambulatory Visit: Payer: Self-pay | Admitting: Internal Medicine

## 2017-07-24 DIAGNOSIS — F429 Obsessive-compulsive disorder, unspecified: Secondary | ICD-10-CM

## 2017-08-11 ENCOUNTER — Encounter: Payer: Self-pay | Admitting: Internal Medicine

## 2017-09-05 ENCOUNTER — Encounter: Payer: 59 | Admitting: Internal Medicine

## 2017-10-03 ENCOUNTER — Ambulatory Visit (INDEPENDENT_AMBULATORY_CARE_PROVIDER_SITE_OTHER): Payer: 59 | Admitting: Internal Medicine

## 2017-10-03 ENCOUNTER — Other Ambulatory Visit: Payer: Self-pay | Admitting: Internal Medicine

## 2017-10-03 ENCOUNTER — Encounter: Payer: Self-pay | Admitting: Internal Medicine

## 2017-10-03 VITALS — BP 136/84 | HR 72 | Resp 16 | Ht 64.0 in | Wt 188.0 lb

## 2017-10-03 DIAGNOSIS — E034 Atrophy of thyroid (acquired): Secondary | ICD-10-CM

## 2017-10-03 DIAGNOSIS — Z1211 Encounter for screening for malignant neoplasm of colon: Secondary | ICD-10-CM

## 2017-10-03 DIAGNOSIS — F429 Obsessive-compulsive disorder, unspecified: Secondary | ICD-10-CM

## 2017-10-03 DIAGNOSIS — G4700 Insomnia, unspecified: Secondary | ICD-10-CM

## 2017-10-03 DIAGNOSIS — Z0001 Encounter for general adult medical examination with abnormal findings: Secondary | ICD-10-CM | POA: Diagnosis not present

## 2017-10-03 DIAGNOSIS — E559 Vitamin D deficiency, unspecified: Secondary | ICD-10-CM

## 2017-10-03 DIAGNOSIS — Z1239 Encounter for other screening for malignant neoplasm of breast: Secondary | ICD-10-CM

## 2017-10-03 DIAGNOSIS — Z0000 Encounter for general adult medical examination without abnormal findings: Secondary | ICD-10-CM

## 2017-10-03 DIAGNOSIS — Z23 Encounter for immunization: Secondary | ICD-10-CM

## 2017-10-03 LAB — POCT URINALYSIS DIPSTICK
Bilirubin, UA: NEGATIVE
Blood, UA: NEGATIVE
Glucose, UA: NEGATIVE
Ketones, UA: NEGATIVE
Leukocytes, UA: NEGATIVE
Nitrite, UA: NEGATIVE
Protein, UA: NEGATIVE
Spec Grav, UA: 1.015
Urobilinogen, UA: 0.2 U/dL
pH, UA: 5

## 2017-10-03 MED ORDER — LEVOTHYROXINE SODIUM 100 MCG PO TABS: 100.0000 ug | ORAL_TABLET | Freq: Every day | ORAL | 1 refills | Status: DC

## 2017-10-03 MED ORDER — ESCITALOPRAM OXALATE 10 MG PO TABS: 10.0000 mg | ORAL_TABLET | Freq: Every day | ORAL | 1 refills | Status: DC

## 2017-10-03 MED ORDER — ZOSTER VAC RECOMB ADJUVANTED 50 MCG/0.5ML IM SUSR
0.5000 mL | Freq: Once | INTRAMUSCULAR | 1 refills | Status: AC
Start: 2017-10-03 — End: 2017-10-03

## 2017-10-03 MED ORDER — ZOLPIDEM TARTRATE 5 MG PO TABS: 5.0000 mg | ORAL_TABLET | Freq: Every evening | ORAL | 5 refills | Status: DC | PRN

## 2017-10-03 NOTE — Progress Notes (Signed)
Date:  10/03/2017   Name:  Robin Madden   DOB:  [birth year]   MRN:  [id number]   Chief Complaint: Annual Exam Robin M Sardo is a 52 y.o. female who presents today for her Complete Annual Exam. She feels fairly well. She reports exercising walking. She reports she is sleeping fairly well. Recent mammogram was normal.  She is due for colonoscopy - wants to do cologuard.  Pap was last done 08/2015.  Thyroid Problem  Presents for follow-up visit. Symptoms include weight gain. Patient reports no anxiety, constipation, depressed mood, diaphoresis, diarrhea, fatigue, palpitations or tremors. The symptoms have been stable.  Insomnia  Primary symptoms: difficulty falling asleep, frequent awakening.  The problem is unchanged. Past treatments include medication (trazodone causes vivid dreams/nightmares).   Atypical facial pain -  Full evaluation from Dr. Sherryll Burger - neurology.  Gabapentin dosed, could not take higher dose. Could not get tegretol because of low WBC.  She is also getting nerve blocks that have helped.  Vitamin D def - now on high dose weekly Vitamin D.  Needs to have levels rechecked.  OCD/anxiety - on lexapro and doing well.  No new sx.  She is working on improving her situation - got out of a poor relationship, started going to church, changed jobs and is exercising.  Review of Systems  Constitutional: Positive for weight gain. Negative for chills, diaphoresis, fatigue and fever.  HENT: Negative for congestion, hearing loss, tinnitus, trouble swallowing and voice change.   Eyes: Negative for visual disturbance.  Respiratory: Negative for cough, chest tightness, shortness of breath and wheezing.   Cardiovascular: Negative for chest pain, palpitations and leg swelling.  Gastrointestinal: Negative for abdominal pain, constipation, diarrhea and vomiting.  Endocrine: Negative for polydipsia and polyuria.  Genitourinary: Negative for dysuria, frequency, genital sores, vaginal  bleeding and vaginal discharge.  Musculoskeletal: Negative for arthralgias, gait problem and joint swelling.  Skin: Negative for color change and rash.  Neurological: Negative for dizziness, tremors, light-headedness and headaches.       Right sided facial pain  Hematological: Negative for adenopathy. Does not bruise/bleed easily.  Psychiatric/Behavioral: Negative for dysphoric mood. The patient has insomnia. The patient is not nervous/anxious.     Patient Active Problem List   Diagnosis Date Noted  . Carpal tunnel syndrome on right 09/20/2016  . 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 Bilateral wrist pain 07/18/2016  . Hypothyroidism 04/04/2015  . OCD (obsessive compulsive disorder) 04/04/2015  . 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 Insomnia 04/04/2015  . Abnormal WBC count 04/04/2015  . Macrocytosis 04/04/2015  . Hx of abnormal cervical Pap smear 04/04/2015  . History of fracture of fibula 04/04/2015    Prior to Admission medications   Medication Sig Start Date End Date Taking? Authorizing Provider  escitalopram (LEXAPRO) 10 MG tablet TAKE 1 TABLET (10 MG TOTAL) BY MOUTH DAILY. 07/24/17  Yes Reubin Milan, MD  Folic Acid-Vit B6-Vit B12 (FOLBEE) 2.5-25-1 MG TABS tablet Take 1 tablet by mouth daily.   Yes [provider]  gabapentin (NEURONTIN) 300 MG capsule TAKE 1 CAPSULE (300 MG TOTAL) BY MOUTH 2 (TWO) TIMES DAILY. 05/26/17  Yes Reubin Milan, MD  levothyroxine (SYNTHROID, LEVOTHROID) 100 MCG tablet TAKE 1 TABLET (100 MCG TOTAL) BY MOUTH DAILY BEFORE BREAKFAST. 02/28/17  Yes Reubin Milan, MD  Probiotic Product (PROBIOTIC DAILY PO) Take by mouth.   Yes [provider]  traZODone (DESYREL) 50 MG tablet TAKE 1 TABLET (50 MG  TOTAL) BY MOUTH AT BEDTIME AS NEEDED FOR SLEEP. 03/30/17  Yes Reubin MilanBerglund, Turhan Chill H, MD  zolpidem (AMBIEN) 5 MG tablet TAKE 1 TABLET BY MOUTH AT BEDTIME AS NEEDED FOR SLEEP 04/01/17  Yes Reubin MilanBerglund, Deirdra Heumann H, MD       [provider]        [provider]    No Known Allergies  Past Surgical History:  Procedure Laterality Date  . CARPAL TUNNEL RELEASE Right 02/2017  . TONSILLECTOMY    . ULNAR COLLATERAL LIGAMENT REPAIR Right 09/06/2016   Procedure: RIGHT ULNA NERVE TRANSPORTATION;  Surgeon: Erin SonsKernodle, Harold, MD;  [facility];  Service: Orthopedics;  Laterality: Right;    Social History   Tobacco Use  . Smoking status: Never Smoker  . Smokeless tobacco: Never Used  Substance Use Topics  . Alcohol use: Yes    Alcohol/week: 8.4 oz    Types: 7 Glasses of wine, 7 Shots of liquor per week    Comment: daily  . Drug use: No     Medication list has been reviewed and updated.  Current Meds  Medication Sig  . escitalopram (LEXAPRO) 10 MG tablet TAKE 1 TABLET (10 MG TOTAL) BY MOUTH DAILY.  Marland Kitchen. Folic Acid-Vit B6-Vit B12 (FOLBEE) 2.5-25-1 MG TABS tablet Take 1 tablet by mouth daily.  Marland Kitchen. gabapentin (NEURONTIN) 300 MG capsule TAKE 1 CAPSULE (300 MG TOTAL) BY MOUTH 2 (TWO) TIMES DAILY.  Marland Kitchen. levothyroxine (SYNTHROID, LEVOTHROID) 100 MCG tablet TAKE 1 TABLET (100 MCG TOTAL) BY MOUTH DAILY BEFORE BREAKFAST.  Marland Kitchen. Probiotic Product (PROBIOTIC DAILY PO) Take by mouth.  . traZODone (DESYREL) 50 MG tablet TAKE 1 TABLET (50 MG TOTAL) BY MOUTH AT BEDTIME AS NEEDED FOR SLEEP.  Marland Kitchen. zolpidem (AMBIEN) 5 MG tablet TAKE 1 TABLET BY MOUTH AT BEDTIME AS NEEDED FOR SLEEP    PHQ 2/9 Scores 10/03/2017  PHQ - 2 Score 0    Physical Exam  Constitutional: She is oriented to person, place, and time. She appears well-developed and well-nourished. No distress.  HENT:  Head: Normocephalic and atraumatic.  Right Ear: Tympanic membrane and ear canal normal.  Left Ear: Tympanic membrane and ear canal normal.  Nose: Right sinus exhibits no maxillary sinus tenderness. Left sinus exhibits no maxillary sinus tenderness.  Mouth/Throat: Uvula is midline and oropharynx is clear and moist.  Eyes: Conjunctivae and EOM are normal. Right eye  exhibits no discharge. Left eye exhibits no discharge. No scleral icterus.  Neck: Normal range of motion. Carotid bruit is not present. No erythema present. No thyromegaly present.  Cardiovascular: Normal rate, regular rhythm, normal heart sounds and normal pulses.  Pulmonary/Chest: Effort normal. No respiratory distress. She has no wheezes. Right breast exhibits no mass, no nipple discharge, no skin change and no tenderness. Left breast exhibits no mass, no nipple discharge, no skin change and no tenderness.  Abdominal: Soft. Bowel sounds are normal. There is no hepatosplenomegaly. There is no tenderness. There is no CVA tenderness.  Musculoskeletal: Normal range of motion.  Lymphadenopathy:    She has no cervical adenopathy.    She has no axillary adenopathy.  Neurological: She is alert and oriented to person, place, and time. She has normal reflexes. No cranial nerve deficit or sensory deficit.  Skin: Skin is warm, dry and intact. No rash noted.  Psychiatric: She has a normal mood and affect. Her speech is normal and behavior is normal. Thought content normal.  Nursing note and vitals reviewed.   BP 136/84   Pulse  72   Resp 16   Ht 5\' 4"  (1.626 m)   Wt 188 lb (85.3 kg)   SpO2 98%   BMI 32.27 kg/m   Assessment and Plan: 1. Annual physical exam Continue exercise Work on low salt diet, reduced calorie diet - CBC with Differential/Platelet - Comprehensive metabolic panel - Lipid panel - POCT urinalysis dipstick  2. Breast cancer screening Recently normal  3. Need for shingles vaccine - Zoster Vaccine Adjuvanted [facility]) injection; Inject 0.5 mLs into the muscle once for 1 dose.  Dispense: 0.5 mL; Refill: 1  4. Insomnia - zolpidem (AMBIEN) 5 MG tablet; Take 1 tablet (5 mg total) by mouth at bedtime as needed. for sleep  Dispense: 30 tablet; Refill: 5  5. OCD (obsessive compulsive disorder) Continue current therapy - escitalopram (LEXAPRO) 10 MG tablet; Take 1 tablet (10 mg  total) by mouth daily.  Dispense: 90 tablet; Refill: 1  6. 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  Dispense: 90 tablet; Refill: 1 - TSH  7. Colon cancer screening - Cologuard  8. Vitamin D deficiency Continue supplement - VITAMIN D 25 Hydroxy (Vit-D Deficiency, Fractures)   Meds ordered this encounter  Medications  . Zoster Vaccine Adjuvanted [facility]) injection    Sig: Inject 0.5 mLs into the muscle once for 1 dose.    Dispense:  0.5 mL    Refill:  1  . zolpidem (AMBIEN) 5 MG tablet    Sig: Take 1 tablet (5 mg total) by mouth at bedtime as needed. for sleep    Dispense:  30 tablet    Refill:  5  . escitalopram (LEXAPRO) 10 MG tablet    Sig: Take 1 tablet (10 mg total) by mouth daily.    Dispense:  90 tablet    Refill:  1  . levothyroxine (SYNTHROID, LEVOTHROID) 100 MCG tablet    Sig: Take 1 tablet (100 mcg total) by mouth daily before breakfast.    Dispense:  90 tablet    Refill:  1    Partially dictated using Animal nutritionist. Any errors are unintentional.  Bari Edward, MD [facility]  10/03/2017  There are no diagnoses linked to this encounter.

## 2017-10-04 LAB — COMPREHENSIVE METABOLIC PANEL
ALT: 77 IU/L — ABNORMAL HIGH (ref 0–32)
AST: 50 IU/L — ABNORMAL HIGH (ref 0–40)
Albumin/Globulin Ratio: 1.9 (ref 1.2–2.2)
Albumin: 4.4 g/dL (ref 3.5–5.5)
Alkaline Phosphatase: 60 IU/L (ref 39–117)
BUN/Creatinine Ratio: 17 (ref 9–23)
BUN: 12 mg/dL (ref 6–24)
Bilirubin Total: 0.4 mg/dL (ref 0.0–1.2)
CO2: 23 mmol/L (ref 20–29)
Calcium: 9.3 mg/dL (ref 8.7–10.2)
Chloride: 102 mmol/L (ref 96–106)
Creatinine, Ser: 0.72 mg/dL (ref 0.57–1.00)
GFR calc Af Amer: 111 mL/min/{1.73_m2} (ref >59)
GFR calc non Af Amer: 97 mL/min/{1.73_m2} (ref >59)
Globulin, Total: 2.3 g/dL (ref 1.5–4.5)
Glucose: 95 mg/dL (ref 65–99)
Potassium: 4.5 mmol/L (ref 3.5–5.2)
Sodium: 141 mmol/L (ref 134–144)
Total Protein: 6.7 g/dL (ref 6.0–8.5)

## 2017-10-04 LAB — CBC WITH DIFFERENTIAL/PLATELET
BASOS: 0 %
Basophils Absolute: 0 10*3/uL (ref 0.0–0.2)
EOS (ABSOLUTE): 0.1 10*3/uL (ref 0.0–0.4)
Eos: 3 %
Hematocrit: 36.3 % (ref 34.0–46.6)
Hemoglobin: 12.3 g/dL (ref 11.1–15.9)
Immature Grans (Abs): 0 10*3/uL (ref 0.0–0.1)
Immature Granulocytes: 0 %
Lymphocytes Absolute: 1 10*3/uL (ref 0.7–3.1)
Lymphs: 39 %
MCH: 36.1 pg — AB (ref 26.6–33.0)
MCHC: 33.9 g/dL (ref 31.5–35.7)
MCV: 107 fL — AB (ref 79–97)
MONOS ABS: 0.3 10*3/uL (ref 0.1–0.9)
Monocytes: 11 %
NEUTROS ABS: 1.2 10*3/uL — AB (ref 1.4–7.0)
Neutrophils: 47 %
PLATELETS: 189 10*3/uL (ref 150–450)
RBC: 3.41 x10E6/uL — ABNORMAL LOW (ref 3.77–5.28)
RDW: 12.4 % (ref 12.3–15.4)
WBC: 2.5 10*3/uL — CL (ref 3.4–10.8)

## 2017-10-04 LAB — LIPID PANEL
CHOL/HDL RATIO: 4.6 ratio — AB (ref 0.0–4.4)
Cholesterol, Total: 315 mg/dL — ABNORMAL HIGH (ref 100–199)
HDL: 69 mg/dL (ref >39)
LDL Calculated: 221 mg/dL — ABNORMAL HIGH (ref 0–99)
Triglycerides: 127 mg/dL (ref 0–149)
VLDL Cholesterol Cal: 25 mg/dL (ref 5–40)

## 2017-10-04 LAB — VITAMIN D 25 HYDROXY (VIT D DEFICIENCY, FRACTURES): Vit D, 25-Hydroxy: 33.7 ng/mL (ref 30.0–100.0)

## 2017-10-04 LAB — TSH: TSH: 1.46 u[IU]/mL (ref 0.450–4.500)

## 2017-10-06 ENCOUNTER — Encounter: Payer: Self-pay | Admitting: Internal Medicine

## 2017-10-06 DIAGNOSIS — E782 Mixed hyperlipidemia: Secondary | ICD-10-CM | POA: Insufficient documentation

## 2017-10-06 DIAGNOSIS — E785 Hyperlipidemia, unspecified: Secondary | ICD-10-CM

## 2017-10-10 ENCOUNTER — Other Ambulatory Visit: Payer: Self-pay | Admitting: Internal Medicine

## 2017-10-10 DIAGNOSIS — G4700 Insomnia, unspecified: Secondary | ICD-10-CM

## 2017-10-22 LAB — COLOGUARD: Cologuard: NEGATIVE

## 2017-11-10 ENCOUNTER — Encounter: Payer: Self-pay | Admitting: Internal Medicine

## 2018-03-27 ENCOUNTER — Other Ambulatory Visit: Payer: Self-pay | Admitting: Internal Medicine

## 2018-03-27 DIAGNOSIS — Z1231 Encounter for screening mammogram for malignant neoplasm of breast: Secondary | ICD-10-CM

## 2018-05-06 ENCOUNTER — Other Ambulatory Visit: Payer: Self-pay | Admitting: Internal Medicine

## 2018-05-06 ENCOUNTER — Ambulatory Visit
Admission: RE | Admit: 2018-05-06 | Discharge: 2018-05-06 | Disposition: A | Discharge status: Home / Self Care | Payer: 59 | Source: Ambulatory Visit | Attending: Internal Medicine | Admitting: Internal Medicine

## 2018-05-06 DIAGNOSIS — Z1231 Encounter for screening mammogram for malignant neoplasm of breast: Secondary | ICD-10-CM

## 2018-05-06 DIAGNOSIS — E034 Atrophy of thyroid (acquired): Secondary | ICD-10-CM

## 2018-05-12 ENCOUNTER — Encounter: Payer: Self-pay | Admitting: Internal Medicine

## 2018-05-19 ENCOUNTER — Other Ambulatory Visit: Payer: Self-pay | Admitting: Internal Medicine

## 2018-05-19 DIAGNOSIS — F429 Obsessive-compulsive disorder, unspecified: Secondary | ICD-10-CM

## 2018-05-19 DIAGNOSIS — G4700 Insomnia, unspecified: Secondary | ICD-10-CM

## 2018-05-20 NOTE — Telephone Encounter (Signed)
LM TO CALL BACK

## 2018-06-08 ENCOUNTER — Encounter: Payer: Self-pay | Admitting: Internal Medicine

## 2018-06-22 ENCOUNTER — Other Ambulatory Visit: Payer: Self-pay | Admitting: Internal Medicine

## 2018-06-22 DIAGNOSIS — G4700 Insomnia, unspecified: Secondary | ICD-10-CM

## 2018-06-23 ENCOUNTER — Ambulatory Visit: Payer: 59 | Admitting: Internal Medicine

## 2018-08-11 ENCOUNTER — Ambulatory Visit: Payer: 59 | Admitting: Internal Medicine

## 2018-09-23 ENCOUNTER — Encounter: Payer: Self-pay | Admitting: Internal Medicine

## 2018-09-23 ENCOUNTER — Ambulatory Visit: Payer: 59 | Admitting: Internal Medicine

## 2018-09-23 ENCOUNTER — Other Ambulatory Visit: Payer: Self-pay

## 2018-09-23 DIAGNOSIS — G4700 Insomnia, unspecified: Secondary | ICD-10-CM | POA: Diagnosis not present

## 2018-09-23 DIAGNOSIS — F429 Obsessive-compulsive disorder, unspecified: Secondary | ICD-10-CM | POA: Diagnosis not present

## 2018-09-23 DIAGNOSIS — E034 Atrophy of thyroid (acquired): Secondary | ICD-10-CM

## 2018-09-23 DIAGNOSIS — G501 Atypical facial pain: Secondary | ICD-10-CM

## 2018-09-23 MED ORDER — LEVOTHYROXINE SODIUM 100 MCG PO TABS: 100.0000 ug | ORAL_TABLET | Freq: Every day | ORAL | 1 refills | Status: DC

## 2018-09-23 MED ORDER — TRAZODONE HCL 50 MG PO TABS: 50.0000 mg | ORAL_TABLET | Freq: Every evening | ORAL | 0 refills | Status: DC | PRN

## 2018-09-23 MED ORDER — GABAPENTIN 300 MG PO CAPS: 300.0000 mg | ORAL_CAPSULE | Freq: Two times a day (BID) | ORAL | 5 refills | Status: DC

## 2018-09-23 MED ORDER — ESCITALOPRAM OXALATE 10 MG PO TABS: 10.0000 mg | ORAL_TABLET | Freq: Every day | ORAL | 1 refills | Status: DC

## 2018-09-23 NOTE — Progress Notes (Signed)
Date:  09/23/2018   Name:  Robin Madden   DOB:  1965/10/22   MRN:  [id number]   Chief Complaint: Insomnia (Pt said she does need ambien anymore. Sleeping fine. No longer taking. ) and Hypothyroidism  Insomnia Primary symptoms: no sleep disturbance.  The problem occurs rarely. The problem has been rapidly improving since onset. How many beverages per day that contain caffeine: 0 - 1.   Thyroid Problem Presents for follow-up visit. Patient reports no anxiety, diaphoresis, fatigue or palpitations. The symptoms have been stable.  Trigeminal neuralgia - has been stable with low grade discomfort most days.  Sometimes she gets sharper more severe pains on the right side.  She did not find that gabapentin taken daily was very helpful but she does take 2 capsules as needed for severe pain. OCD - pt feels that she is doing well on current medications.  She is due for a refill before her next appointment. She has been exercising regularly and had changed her diet significantly.  She has recently lost 12 lbs and hopes to continue the trend with a weight loss goal of 50 lbs.  Lab Results  Component Value Date   TSH 1.460 10/03/2017    Review of Systems  Constitutional: Negative for chills, diaphoresis, fatigue, fever and unexpected weight change.  Respiratory: Negative for chest tightness and shortness of breath.   Cardiovascular: Negative for chest pain, palpitations and leg swelling.  Neurological: Negative for dizziness and headaches.       Intermittent facial pain   Psychiatric/Behavioral: Negative for dysphoric mood and sleep disturbance. The patient has insomnia. The patient is not nervous/anxious.     Patient Active Problem List   Diagnosis Date Noted  . Hyperlipidemia 10/06/2017  . Carpal tunnel syndrome on right 09/20/2016  . 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 Bilateral wrist pain 07/18/2016  . Hypothyroidism 04/04/2015  . OCD (obsessive compulsive disorder)  04/04/2015  . 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 Insomnia 04/04/2015  . Abnormal WBC count 04/04/2015  . Macrocytosis 04/04/2015  . Hx of abnormal cervical Pap smear 04/04/2015  . History of fracture of fibula 04/04/2015    No Known Allergies  Past Surgical History:  Procedure Laterality Date  . CARPAL TUNNEL RELEASE Right 02/2017  . TONSILLECTOMY    . ULNAR COLLATERAL LIGAMENT REPAIR Right 09/06/2016   Procedure: RIGHT ULNA NERVE TRANSPORTATION;  Surgeon: Erin SonsKernodle, Harold, MD;  [facility];  Service: Orthopedics;  Laterality: Right;    Social History   Tobacco Use  . Smoking status: Never Smoker  . Smokeless tobacco: Never Used  Substance Use Topics  . Alcohol use: Yes    Alcohol/week: 14.0 standard drinks    Types: 7 Glasses of wine, 7 Shots of liquor per week    Comment: daily  . Drug use: No     Medication list has been reviewed and updated.  Current Meds  Medication Sig  . Ascorbic Acid (VITAMIN C) 1000 MG tablet Take 1,000 mg by mouth daily.  . Biotin 10 MG CAPS Take by mouth.  . calcium-vitamin D (OSCAL WITH D) 250-125 MG-UNIT tablet Take 1 tablet by mouth daily.  . Emollient (AVEENO STRESS RELIEF EX) Apply topically.  Marland Kitchen. escitalopram (LEXAPRO) 10 MG tablet TAKE 1 TABLET BY MOUTH EVERY DAY  . Folic Acid-Vit B6-Vit B12 (FOLBEE) 2.5-25-1 MG TABS tablet Take 1 tablet by mouth daily.  Marland Kitchen. levothyroxine (SYNTHROID, LEVOTHROID) 100 MCG tablet TAKE 1 TABLET BY MOUTH EVERY  DAY BEFORE BREAKFAST  . Probiotic Product (PROBIOTIC DAILY PO) Take by mouth.  . traZODone (DESYREL) 50 MG tablet TAKE 1 TABLET (50 MG TOTAL) BY MOUTH AT BEDTIME AS NEEDED FOR SLEEP.  . Vitamin D, Ergocalciferol, (DRISDOL) 1.25 MG (50000 UT) CAPS capsule Take 50,000 Units by mouth every 7 (seven) days.    PHQ 2/9 Scores 09/23/2018 10/03/2017  PHQ - 2 Score 0 0  PHQ- 9 Score 0 -    BP Readings from Last 3 Encounters:  09/23/18 118/78  10/03/17 136/84  01/18/17 (!) 142/81     Physical Exam Vitals signs and nursing note reviewed.  Constitutional:      General: She is not in acute distress.    Appearance: She is well-developed.  HENT:     Head: Normocephalic and atraumatic.  Neck:     Musculoskeletal: Normal range of motion.  Cardiovascular:     Rate and Rhythm: Normal rate and regular rhythm.     Pulses: Normal pulses.  Pulmonary:     Effort: Pulmonary effort is normal. No respiratory distress.  Musculoskeletal: Normal range of motion.     Right lower leg: No edema.     Left lower leg: No edema.  Skin:    General: Skin is warm and dry.     Capillary Refill: Capillary refill takes less than 2 seconds.     Findings: No rash.  Neurological:     Mental Status: She is alert and oriented to person, place, and time.  Psychiatric:        Behavior: Behavior normal.        Thought Content: Thought content normal.     Wt Readings from Last 3 Encounters:  09/23/18 186 lb (84.4 kg)  10/03/17 188 lb (85.3 kg)  01/18/17 170 lb (77.1 kg)    BP 118/78   Pulse 87   Ht 5\' 4"  (1.626 m)   Wt 186 lb (84.4 kg)   SpO2 97%   BMI 31.93 kg/m   Assessment and Plan: 1. Insomnia Continue trazodone for now, may be able to wean off over the next few months - traZODone (DESYREL) 50 MG tablet; Take 1 tablet (50 mg total) by mouth at bedtime as needed for sleep.  Dispense: 90 tablet; Refill: 0  2. Atypical facial pain Continue gabapentin PRN - gabapentin (NEURONTIN) 300 MG capsule; Take 1 capsule (300 mg total) by mouth 2 (two) times daily.  Dispense: 60 capsule; Refill: 5  3. 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  Dispense: 90 tablet; Refill: 1  4. OCD (obsessive compulsive disorder) Doing well on medications. - escitalopram (LEXAPRO) 10 MG tablet; Take 1 tablet (10 mg total) by mouth daily.  Dispense: 90 tablet; Refill: 1  Continue  healthy diet, limited carbs and regular exercise.  Partially dictated using Editor, commissioning. Any errors are unintentional.  Halina Maidens, MD [facility]  09/23/2018

## 2018-09-27 ENCOUNTER — Encounter: Payer: Self-pay | Admitting: Internal Medicine

## 2018-10-03 ENCOUNTER — Encounter: Payer: Self-pay | Admitting: Internal Medicine

## 2018-10-05 ENCOUNTER — Other Ambulatory Visit: Payer: Self-pay | Admitting: Internal Medicine

## 2018-10-05 DIAGNOSIS — Z23 Encounter for immunization: Secondary | ICD-10-CM

## 2018-10-05 MED ORDER — SHINGRIX 50 MCG/0.5ML IM SUSR: 0.5000 mL | Freq: Once | INTRAMUSCULAR | 0 refills | Status: AC

## 2018-10-05 NOTE — Telephone Encounter (Signed)
Patient said [facility] needs new shingrix Rx for 2nd dose since her original Rx was expired. Please Advise.

## 2018-10-15 ENCOUNTER — Other Ambulatory Visit: Payer: Self-pay | Admitting: Internal Medicine

## 2018-10-15 DIAGNOSIS — G501 Atypical facial pain: Secondary | ICD-10-CM

## 2018-11-15 ENCOUNTER — Encounter: Payer: Self-pay | Admitting: Internal Medicine

## 2018-12-19 ENCOUNTER — Other Ambulatory Visit: Payer: Self-pay | Admitting: Internal Medicine

## 2018-12-19 DIAGNOSIS — G4700 Insomnia, unspecified: Secondary | ICD-10-CM

## 2019-01-21 ENCOUNTER — Encounter: Payer: Self-pay | Admitting: Internal Medicine

## 2019-01-28 ENCOUNTER — Other Ambulatory Visit: Payer: Self-pay

## 2019-01-28 ENCOUNTER — Ambulatory Visit (INDEPENDENT_AMBULATORY_CARE_PROVIDER_SITE_OTHER): Payer: BLUE CROSS/BLUE SHIELD | Admitting: Internal Medicine

## 2019-01-28 ENCOUNTER — Encounter: Payer: Self-pay | Admitting: Internal Medicine

## 2019-01-28 VITALS — BP 128/64 | HR 72 | Ht 64.0 in | Wt 155.0 lb

## 2019-01-28 DIAGNOSIS — E782 Mixed hyperlipidemia: Secondary | ICD-10-CM | POA: Diagnosis not present

## 2019-01-28 DIAGNOSIS — Z0000 Encounter for general adult medical examination without abnormal findings: Secondary | ICD-10-CM | POA: Diagnosis not present

## 2019-01-28 DIAGNOSIS — E039 Hypothyroidism, unspecified: Secondary | ICD-10-CM | POA: Diagnosis not present

## 2019-01-28 DIAGNOSIS — F5101 Primary insomnia: Secondary | ICD-10-CM | POA: Diagnosis not present

## 2019-01-28 DIAGNOSIS — Z1231 Encounter for screening mammogram for malignant neoplasm of breast: Secondary | ICD-10-CM

## 2019-01-28 DIAGNOSIS — F429 Obsessive-compulsive disorder, unspecified: Secondary | ICD-10-CM

## 2019-01-28 DIAGNOSIS — G501 Atypical facial pain: Secondary | ICD-10-CM

## 2019-01-28 LAB — POCT URINALYSIS DIPSTICK
Bilirubin, UA: NEGATIVE
Blood, UA: NEGATIVE
Glucose, UA: NEGATIVE
Ketones, UA: NEGATIVE
Leukocytes, UA: NEGATIVE
Nitrite, UA: NEGATIVE
Protein, UA: NEGATIVE
Spec Grav, UA: 1.015 (ref 1.010–1.025)
Urobilinogen, UA: 0.2 E.U./dL
pH, UA: 5 (ref 5.0–8.0)

## 2019-01-28 MED ORDER — GABAPENTIN 300 MG PO CAPS: 300.0000 mg | ORAL_CAPSULE | Freq: Two times a day (BID) | ORAL | 3 refills | Status: DC

## 2019-01-28 MED ORDER — ESCITALOPRAM OXALATE 10 MG PO TABS: 10.0000 mg | ORAL_TABLET | Freq: Every day | ORAL | 3 refills | Status: DC

## 2019-01-28 MED ORDER — TRAZODONE HCL 50 MG PO TABS: 50.0000 mg | ORAL_TABLET | Freq: Every evening | ORAL | 5 refills | Status: DC | PRN

## 2019-01-28 MED ORDER — LEVOTHYROXINE SODIUM 100 MCG PO TABS: 100.0000 ug | ORAL_TABLET | Freq: Every day | ORAL | 3 refills | Status: DC

## 2019-01-28 NOTE — Progress Notes (Signed)
Date:  01/28/2019   Name:  Robin Madden   DOB:  01-Feb-1966   MRN:  [id number]   Chief Complaint: Annual Exam (Breast Exam. Pap in 2 more years.) Robin Madden is a 53 y.o. female who presents today for her Complete Annual Exam. She feels well. She reports exercising daily and losing weight steadily. She reports she is sleeping fairly well. She denies breast issues.  Mammogram  04/2018 Pap smear 08/2015 neg with cotesting Colonoscopy - Cologuard 09/2017  Thyroid Problem Presents for follow-up visit. Patient reports no anxiety, constipation, diarrhea, fatigue, palpitations or tremors. The symptoms have been stable.  Insomnia Primary symptoms: no sleep disturbance.     Review of Systems  Constitutional: Positive for unexpected weight change (30 lbs with effort). Negative for chills, fatigue and fever.  HENT: Negative for congestion, hearing loss, tinnitus, trouble swallowing and voice change.   Eyes: Negative for visual disturbance.  Respiratory: Negative for cough, chest tightness, shortness of breath and wheezing.   Cardiovascular: Negative for chest pain, palpitations and leg swelling.  Gastrointestinal: Negative for abdominal pain, constipation, diarrhea and vomiting.  Endocrine: Negative for polydipsia and polyuria.  Genitourinary: Negative for dysuria, frequency, genital sores, vaginal bleeding and vaginal discharge.  Musculoskeletal: Negative for arthralgias, gait problem and joint swelling.  Skin: Negative for color change and rash.  Allergic/Immunologic: Negative for environmental allergies.  Neurological: Negative for dizziness, tremors, light-headedness and headaches.  Hematological: Negative for adenopathy. Does not bruise/bleed easily.  Psychiatric/Behavioral: Negative for dysphoric mood and sleep disturbance. The patient has insomnia. The patient is not nervous/anxious.     Patient Active Problem List   Diagnosis Date Noted  . Mixed hyperlipidemia  10/06/2017  . Carpal tunnel syndrome on right 09/20/2016  . 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 Bilateral wrist pain 07/18/2016  . Acquired hypothyroidism 04/04/2015  . OCD (obsessive compulsive disorder) 04/04/2015  . 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 Primary insomnia 04/04/2015  . Abnormal WBC count 04/04/2015  . Macrocytosis 04/04/2015  . Hx of abnormal cervical Pap smear 04/04/2015    No Known Allergies  Past Surgical History:  Procedure Laterality Date  . CARPAL TUNNEL RELEASE Right 02/2017  . TONSILLECTOMY    . ULNAR COLLATERAL LIGAMENT REPAIR Right 09/06/2016   Procedure: RIGHT ULNA NERVE TRANSPORTATION;  Surgeon: Erin SonsKernodle, Harold, MD;  [facility];  Service: Orthopedics;  Laterality: Right;    Social History   Tobacco Use  . Smoking status: Never Smoker  . Smokeless tobacco: Never Used  Substance Use Topics  . Alcohol use: Yes    Alcohol/week: 14.0 standard drinks    Types: 7 Glasses of wine, 7 Shots of liquor per week    Comment: daily  . Drug use: No     Medication list has been reviewed and updated.  Current Meds  Medication Sig  . calcium-vitamin D (OSCAL WITH D) 250-125 MG-UNIT tablet Take 1 tablet by mouth daily.  Marland Kitchen. escitalopram (LEXAPRO) 10 MG tablet Take 1 tablet (10 mg total) by mouth daily.  . Folic Acid-Vit B6-Vit B12 (FOLBEE) 2.5-25-1 MG TABS tablet Take 1 tablet by mouth daily.  Marland Kitchen. gabapentin (NEURONTIN) 300 MG capsule TAKE 1 CAPSULE BY MOUTH TWICE A DAY  . levothyroxine (SYNTHROID) 100 MCG tablet Take 1 tablet (100 mcg total) by mouth daily before breakfast.  . Multiple Vitamins-Minerals (MULTIVITAMIN WITH MINERALS) tablet Take 1 tablet by mouth daily.  . traZODone (DESYREL) 50 MG tablet TAKE 1 TABLET (50 MG TOTAL)  BY MOUTH AT BEDTIME AS NEEDED FOR SLEEP.  . Vitamin D, Ergocalciferol, (DRISDOL) 1.25 MG (50000 UT) CAPS capsule Take 50,000 Units by mouth every 7 (seven) days.  Marland Kitchen zolpidem (AMBIEN) 5 MG tablet TAKE 1 TABLET  BY MOUTH EVERY DAY AT BEDTIME AS NEEDED FOR SLEEP    PHQ 2/9 Scores 01/28/2019 09/23/2018 10/03/2017  PHQ - 2 Score 1 0 0  PHQ- 9 Score - 0 -    BP Readings from Last 3 Encounters:  01/28/19 128/64  09/23/18 118/78  10/03/17 136/84    Physical Exam Vitals signs and nursing note reviewed.  Constitutional:      General: She is not in acute distress.    Appearance: She is well-developed.  HENT:     Head: Normocephalic and atraumatic.     Right Ear: Tympanic membrane and ear canal normal.     Left Ear: Tympanic membrane and ear canal normal.     Nose:     Right Sinus: No maxillary sinus tenderness.     Left Sinus: No maxillary sinus tenderness.  Eyes:     General: No scleral icterus.       Right eye: No discharge.        Left eye: No discharge.     Conjunctiva/sclera: Conjunctivae normal.  Neck:     Musculoskeletal: Normal range of motion. No erythema.     Thyroid: No thyromegaly.     Vascular: No carotid bruit.  Cardiovascular:     Rate and Rhythm: Normal rate and regular rhythm.     Pulses: Normal pulses.     Heart sounds: Normal heart sounds.  Pulmonary:     Effort: Pulmonary effort is normal. No respiratory distress.     Breath sounds: No wheezing.  Chest:     Breasts:        Right: No mass, nipple discharge, skin change or tenderness.        Left: No mass, nipple discharge, skin change or tenderness.  Abdominal:     General: Bowel sounds are normal.     Palpations: Abdomen is soft.     Tenderness: There is no abdominal tenderness.  Musculoskeletal: Normal range of motion.     Right lower leg: No edema.     Left lower leg: No edema.  Lymphadenopathy:     Cervical: No cervical adenopathy.  Skin:    General: Skin is warm and dry.     Capillary Refill: Capillary refill takes less than 2 seconds.     Findings: No rash.  Neurological:     General: No focal deficit present.     Mental Status: She is alert and oriented to person, place, and time.     Cranial Nerves:  No cranial nerve deficit.     Sensory: No sensory deficit.     Deep Tendon Reflexes: Reflexes are normal and symmetric.  Psychiatric:        Speech: Speech normal.        Behavior: Behavior normal.        Thought Content: Thought content normal.     Wt Readings from Last 3 Encounters:  01/28/19 155 lb (70.3 kg)  09/23/18 186 lb (84.4 kg)  10/03/17 188 lb (85.3 kg)    BP 128/64   Pulse 72   Ht 5\' 4"  (1.626 m)   Wt 155 lb (70.3 kg)   SpO2 96%   BMI 26.61 kg/m   Assessment and Plan: 1. Annual physical exam Normal exam Excellent weight loss  results - CBC with Differential/Platelet - Comprehensive metabolic panel - POCT urinalysis dipstick  2. Encounter for screening mammogram for breast cancer Pt be scheduled in February - MM 3D SCREEN BREAST BILATERAL; Future  3. Acquired hypothyroidism Clinically stable with no symptoms to suggest poor control Weight loss is expected with her efforts - TSH + free T4 - levothyroxine (SYNTHROID) 100 MCG tablet; Take 1 tablet (100 mcg total) by mouth daily before breakfast.  Dispense: 90 tablet; Refill: 3  4. Primary insomnia Sleep is fairly good on Trazodone nightly She uses Ambien only rarely at this time - traZODone (DESYREL) 50 MG tablet; Take 1 tablet (50 mg total) by mouth at bedtime as needed for sleep.  Dispense: 30 tablet; Refill: 5  5. Mixed hyperlipidemia Will check labs and advise - Lipid panel  6. OCD (obsessive compulsive disorder) Clinically stable and doing well on current medications No adverse side effects noted, no SI/HI. - escitalopram (LEXAPRO) 10 MG tablet; Take 1 tablet (10 mg total) by mouth daily.  Dispense: 90 tablet; Refill: 3  7. Atypical facial pain Maintained on gabapentin - gabapentin (NEURONTIN) 300 MG capsule; Take 1 capsule (300 mg total) by mouth 2 (two) times daily.  Dispense: 180 capsule; Refill: 3   Partially dictated using Editor, commissioning. Any errors are unintentional.  Halina Maidens,  MD [facility]  01/28/2019

## 2019-01-29 LAB — COMPREHENSIVE METABOLIC PANEL
ALT: 16 IU/L (ref 0–32)
AST: 19 IU/L (ref 0–40)
Albumin/Globulin Ratio: 2 (ref 1.2–2.2)
Albumin: 4.5 g/dL (ref 3.8–4.9)
Alkaline Phosphatase: 58 IU/L (ref 39–117)
BUN/Creatinine Ratio: 12 (ref 9–23)
BUN: 9 mg/dL (ref 6–24)
Bilirubin Total: 0.4 mg/dL (ref 0.0–1.2)
CO2: 25 mmol/L (ref 20–29)
Calcium: 9.6 mg/dL (ref 8.7–10.2)
Chloride: 103 mmol/L (ref 96–106)
Creatinine, Ser: 0.75 mg/dL (ref 0.57–1.00)
GFR calc Af Amer: 105 mL/min/{1.73_m2} (ref >59)
GFR calc non Af Amer: 91 mL/min/{1.73_m2} (ref >59)
Globulin, Total: 2.3 g/dL (ref 1.5–4.5)
Glucose: 102 mg/dL — ABNORMAL HIGH (ref 65–99)
Potassium: 4.3 mmol/L (ref 3.5–5.2)
Sodium: 139 mmol/L (ref 134–144)
Total Protein: 6.8 g/dL (ref 6.0–8.5)

## 2019-01-29 LAB — CBC WITH DIFFERENTIAL/PLATELET
Basophils Absolute: 0 10*3/uL (ref 0.0–0.2)
Basos: 1 %
EOS (ABSOLUTE): 0.1 10*3/uL (ref 0.0–0.4)
Eos: 3 %
Hematocrit: 36.1 % (ref 34.0–46.6)
Hemoglobin: 12.6 g/dL (ref 11.1–15.9)
Immature Grans (Abs): 0 10*3/uL (ref 0.0–0.1)
Immature Granulocytes: 0 %
Lymphocytes Absolute: 1.2 10*3/uL (ref 0.7–3.1)
Lymphs: 42 %
MCH: 34.2 pg — ABNORMAL HIGH (ref 26.6–33.0)
MCHC: 34.9 g/dL (ref 31.5–35.7)
MCV: 98 fL — ABNORMAL HIGH (ref 79–97)
Monocytes Absolute: 0.3 10*3/uL (ref 0.1–0.9)
Monocytes: 10 %
Neutrophils Absolute: 1.3 10*3/uL — ABNORMAL LOW (ref 1.4–7.0)
Neutrophils: 44 %
Platelets: 201 10*3/uL (ref 150–450)
RBC: 3.68 x10E6/uL — ABNORMAL LOW (ref 3.77–5.28)
RDW: 12.2 % (ref 11.7–15.4)
WBC: 2.9 10*3/uL — ABNORMAL LOW (ref 3.4–10.8)

## 2019-01-29 LAB — TSH+FREE T4
Free T4: 1.45 ng/dL (ref 0.82–1.77)
TSH: 0.046 u[IU]/mL — ABNORMAL LOW (ref 0.450–4.500)

## 2019-01-29 LAB — LIPID PANEL
Chol/HDL Ratio: 4.6 ratio — ABNORMAL HIGH (ref 0.0–4.4)
Cholesterol, Total: 266 mg/dL — ABNORMAL HIGH (ref 100–199)
HDL: 58 mg/dL (ref >39)
LDL Chol Calc (NIH): 181 mg/dL — ABNORMAL HIGH (ref 0–99)
Triglycerides: 150 mg/dL — ABNORMAL HIGH (ref 0–149)
VLDL Cholesterol Cal: 27 mg/dL (ref 5–40)

## 2019-02-09 ENCOUNTER — Encounter: Payer: Self-pay | Admitting: Internal Medicine

## 2019-03-23 ENCOUNTER — Encounter: Payer: Self-pay | Admitting: Internal Medicine

## 2019-05-04 ENCOUNTER — Other Ambulatory Visit: Payer: Self-pay | Admitting: Internal Medicine

## 2019-05-04 DIAGNOSIS — F5101 Primary insomnia: Secondary | ICD-10-CM

## 2019-05-10 ENCOUNTER — Other Ambulatory Visit: Payer: Self-pay

## 2019-05-10 ENCOUNTER — Ambulatory Visit
Admission: RE | Admit: 2019-05-10 | Discharge: 2019-05-10 | Disposition: A | Discharge status: Home / Self Care | Payer: BC Managed Care – PPO | Source: Ambulatory Visit | Attending: Internal Medicine | Admitting: Internal Medicine

## 2019-05-10 DIAGNOSIS — Z1231 Encounter for screening mammogram for malignant neoplasm of breast: Secondary | ICD-10-CM | POA: Insufficient documentation

## 2019-06-30 ENCOUNTER — Encounter: Payer: Self-pay | Admitting: Internal Medicine

## 2019-12-11 ENCOUNTER — Ambulatory Visit
Admission: EM | Admit: 2019-12-11 | Discharge: 2019-12-11 | Disposition: A | Discharge status: Home / Self Care | Payer: BC Managed Care – PPO | Attending: Emergency Medicine | Admitting: Emergency Medicine

## 2019-12-11 ENCOUNTER — Encounter: Payer: Self-pay | Admitting: Emergency Medicine

## 2019-12-11 ENCOUNTER — Other Ambulatory Visit: Payer: Self-pay

## 2019-12-11 DIAGNOSIS — F419 Anxiety disorder, unspecified: Secondary | ICD-10-CM | POA: Insufficient documentation

## 2019-12-11 DIAGNOSIS — E039 Hypothyroidism, unspecified: Secondary | ICD-10-CM | POA: Insufficient documentation

## 2019-12-11 DIAGNOSIS — F429 Obsessive-compulsive disorder, unspecified: Secondary | ICD-10-CM | POA: Insufficient documentation

## 2019-12-11 DIAGNOSIS — E782 Mixed hyperlipidemia: Secondary | ICD-10-CM | POA: Insufficient documentation

## 2019-12-11 DIAGNOSIS — J309 Allergic rhinitis, unspecified: Secondary | ICD-10-CM | POA: Diagnosis not present

## 2019-12-11 DIAGNOSIS — Z20822 Contact with and (suspected) exposure to covid-19: Secondary | ICD-10-CM | POA: Insufficient documentation

## 2019-12-11 DIAGNOSIS — Z79899 Other long term (current) drug therapy: Secondary | ICD-10-CM | POA: Diagnosis not present

## 2019-12-11 LAB — GROUP A STREP BY PCR: Group A Strep by PCR: NOT DETECTED

## 2019-12-11 MED ORDER — PREDNISONE 10 MG (21) PO TBPK: ORAL_TABLET | ORAL | 0 refills | Status: DC

## 2019-12-11 MED ORDER — AMOXICILLIN-POT CLAVULANATE 875-125 MG PO TABS: 1.0000 | ORAL_TABLET | Freq: Two times a day (BID) | ORAL | 0 refills | Status: AC

## 2019-12-11 NOTE — ED Provider Notes (Signed)
[facility] - [place], [place]   Name: Robin Madden DOB: 04/16/1965 MRN: [id number] CSN: [id number] PCP: Reubin Milan, MD  Arrival date and time:  12/11/19 [phone]  Chief Complaint:  Sore Throat and Nasal Congestion   NOTE: Prior to seeing the patient today, I have reviewed the triage nursing documentation and vital signs. Clinical staff has updated patient's PMH/PSHx, current medication list, and drug allergies/intolerances to ensure comprehensive history available to assist in medical decision making.   History:   HPI: Robin Madden is a 54 y.o. female who presents today with complaints below.  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  She denies any fevers or problems breathing.  She is currently using Flonase daily for allergies and started taking guaifenesin for her chest congestion.  She denies any recent antibiotic use.  She states she has used oral prednisone for these similar symptoms in the past.  Past Medical History:  Diagnosis Date  . Abnormal WBC count    "has been that way forever, with no explanation" (low)  . Anxiety   . Atypical facial pain    right side  . Complication of anesthesia   . History of fracture of fibula 04/04/2015  . Hypothyroid   . OCD (obsessive compulsive disorder)   . PONV (postoperative nausea and vomiting)    after tonsillectomy  . Wears contact lenses     Past Surgical History:  Procedure Laterality Date  .  CARPAL TUNNEL RELEASE Right 02/2017  . TONSILLECTOMY    . ULNAR COLLATERAL LIGAMENT REPAIR Right 09/06/2016   Procedure: RIGHT ULNA NERVE TRANSPORTATION;  Surgeon: Erin Sons, MD;  Location: [facility];  Service: Orthopedics;  Laterality: Right;    Family History  Problem Relation Age of Onset  . Mental illness Mother   . Melanoma Brother   . CAD Brother   . Breast cancer Neg Hx     Social History   Tobacco Use  . Smoking status: Never Smoker  . Smokeless tobacco: Never Used  Vaping Use  . Vaping Use: Never used  Substance Use Topics  . Alcohol use: Yes    Alcohol/week: 14.0 standard drinks    Types: 7 Glasses of wine, 7 Shots of liquor per week    Comment: daily  . Drug use: No    Patient Active Problem List   Diagnosis Date Noted  . Mixed hyperlipidemia 10/06/2017  . Carpal tunnel syndrome on right 09/20/2016  . 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 Bilateral wrist pain 07/18/2016  . Acquired hypothyroidism 04/04/2015  . OCD (obsessive compulsive disorder) 04/04/2015  . 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 Primary insomnia 04/04/2015  . Abnormal WBC count 04/04/2015  . Macrocytosis 04/04/2015  . Hx of abnormal cervical Pap smear 04/04/2015    Home Medications:    Current Meds  Medication Sig  . calcium-vitamin D (OSCAL WITH D) 250-125 MG-UNIT tablet Take 1 tablet by mouth daily.  Marland Kitchen escitalopram (LEXAPRO) 10 MG tablet Take 1 tablet (10 mg total)  by mouth daily.  . Folic Acid-Vit B6-Vit B12 (FOLBEE) 2.5-25-1 MG TABS tablet Take 1 tablet by mouth daily.  Marland Kitchen gabapentin (NEURONTIN) 300 MG capsule Take 1 capsule (300 mg total) by mouth 2 (two) times daily.  Marland Kitchen levothyroxine (SYNTHROID) 100 MCG tablet Take 1 tablet (100 mcg total) by mouth daily before breakfast.  . Multiple Vitamins-Minerals (MULTIVITAMIN WITH MINERALS) tablet Take 1 tablet by mouth daily.  . traZODone (DESYREL) 50 MG tablet TAKE 1 TABLET (50 MG TOTAL) BY MOUTH AT BEDTIME AS NEEDED  FOR SLEEP.  . Vitamin D, Ergocalciferol, (DRISDOL) 1.25 MG (50000 UT) CAPS capsule Take 50,000 Units by mouth every 7 (seven) days.  Marland Kitchen zolpidem (AMBIEN) 5 MG tablet TAKE 1 TABLET BY MOUTH EVERY DAY AT BEDTIME AS NEEDED FOR SLEEP    Allergies:   Patient has no known allergies.  Review of Systems (ROS): Review of Systems  Constitutional: Positive for fatigue. Negative for activity change, appetite change, diaphoresis and fever.  HENT: Positive for congestion, ear pain, postnasal drip, sinus pressure, sore throat and trouble swallowing. Negative for ear discharge, facial swelling, hearing loss, sinus pain and sneezing.   Eyes: Negative for pain.  Respiratory: Positive for cough. Negative for shortness of breath and wheezing.   Gastrointestinal: Negative for constipation, nausea and vomiting.  Musculoskeletal: Positive for myalgias.  All other systems reviewed and are negative.    Vital Signs: Today's Vitals   12/11/19 0851 12/11/19 0854  BP:  (!) 145/80  Pulse:  69  Resp:  18  Temp:  98.6 F (37 C)  TempSrc:  Oral  SpO2:  97%  Weight: 154 lb 15.7 oz (70.3 kg)   Height: 5\' 4"  (1.626 m)   PainSc: 5      Physical Exam: Physical Exam Vitals and nursing note reviewed.  Constitutional:      General: She is not in acute distress.    Appearance: She is well-developed.  HENT:     Head: Normocephalic and atraumatic.     Right Ear: No middle ear effusion.     Left Ear:  No middle ear effusion.     Ears:     Comments: Clear mid ear effusion    Mouth/Throat:     Pharynx: Posterior oropharyngeal erythema present. No pharyngeal swelling or oropharyngeal exudate.     Tonsils: No tonsillar exudate.  Eyes:     Conjunctiva/sclera: Conjunctivae normal.  Cardiovascular:     Rate and Rhythm: Normal rate and regular rhythm.     Heart sounds: No murmur heard.   Pulmonary:     Effort: Pulmonary effort is normal. No respiratory distress.     Breath sounds: Normal breath sounds.    Abdominal:     Palpations: Abdomen is soft.     Tenderness: There is no abdominal tenderness.  Musculoskeletal:     Cervical back: Neck supple.  Skin:    General: Skin is warm and dry.  Neurological:     Mental Status: She is alert.      Urgent Care Treatments / Results:   LABS: PLEASE NOTE: all labs that were ordered this encounter are listed, however only abnormal results are displayed. Labs Reviewed  GROUP A STREP BY PCR  SARS CORONAVIRUS 2 (TAT 6-24 HRS)    EKG: -None  RADIOLOGY: No results found.  PROCEDURES: Procedures  MEDICATIONS RECEIVED THIS VISIT: Medications - No data to display  PERTINENT CLINICAL COURSE NOTES/UPDATES:   Initial Impression / Assessment and Plan / Urgent Care Course:  Pertinent  labs & imaging results that were available during my care of the patient were personally reviewed by me and considered in my medical decision making (see lab/imaging section of note for values and interpretations).  Robin Madden is a 54 y.o. female who presents to [facility] today with complaints of cough, sore throat, and ear pain, diagnosed with allergic rhinitis, and treated as such with the medications and directions below. NP and patient reviewed discharge instructions below during visit.   Patient is well appearing overall in clinic today. She does not appear to be in any acute distress. Presenting symptoms (see HPI) and exam as documented above.   I have reviewed the follow up and strict return precautions for any new or worsening symptoms. Patient is aware of symptoms that would be deemed urgent/emergent, and would thus require further evaluation either here or in the emergency department. At the time of discharge, she verbalized understanding and consent with the discharge plan as it was reviewed with her. All questions were fielded by provider and/or clinic staff prior to patient discharge.    Final Clinical Impressions / Urgent Care Diagnoses:    Final diagnoses:  Allergic rhinitis, unspecified seasonality, unspecified trigger    New Prescriptions:  [place] Controlled Substance Registry consulted? Not Applicable  Meds ordered this encounter  Medications  . predniSONE (STERAPRED UNI-PAK 21 TAB) 10 MG (21) TBPK tablet    Sig: Take as instructed on package (60, 50, 40, 30, 20, 10)    Dispense:  1 each    Refill:  0  . amoxicillin-clavulanate (AUGMENTIN) 875-125 MG tablet    Sig: Take 1 tablet by mouth every 12 (twelve) hours for 7 days.    Dispense:  14 tablet    Refill:  0      Discharge Instructions     You were seen for sore throat and swollen glands and are being treated for allergies and possible sinus infection.   -When you use her Flonase, make sure you aim towards the side of your face. -Use over-the-counter cetirizine (Zyrtec) or levocetirizine (Xyzal) daily to help with your postnasal drainage. - From ordering a 5-day prednisone taper to help with your swelling glands and sinus pressure. Use it to completion. - If your symptoms do not resolve after completing a 5-day taper, pickup prescription to treat a bacterial sinus infection at a local pharmacy. Only below the medication if your symptoms have not resolved. - If your COVID-19 or strep test returned as positive, a nurse will contact you with further directions.  Take care, Dr. Sharlet Salina, NP-c     Recommended Follow up Care:  Patient encouraged to follow up with the following provider within the specified time frame, or sooner as dictated by the severity of her symptoms. As always, she was instructed that for any urgent/emergent care needs, she should seek care either here or in the emergency department for more immediate evaluation.   Bailey Mech, DNP, NP-c    Bailey Mech, NP 12/11/19 [phone]

## 2019-12-11 NOTE — Discharge Instructions (Signed)
You were seen for sore throat and swollen glands and are being treated for allergies and possible sinus infection.   -When you use her Flonase, make sure you aim towards the side of your face. -Use over-the-counter cetirizine (Zyrtec) or levocetirizine (Xyzal) daily to help with your postnasal drainage. - From ordering a 5-day prednisone taper to help with your swelling glands and sinus pressure. Use it to completion. - If your symptoms do not resolve after completing a 5-day taper, pickup prescription to treat a bacterial sinus infection at a local pharmacy. Only below the medication if your symptoms have not resolved. - If your COVID-19 or strep test returned as positive, a nurse will contact you with further directions.  Take care, Dr. Sharlet Salina, NP-c

## 2019-12-11 NOTE — ED Triage Notes (Signed)
Pt c/o sore throat, nasal congestion, fatigue, headache. Started about 5 days ago. She states she took an at home covid test and was negative. She states she was covid vaccinated.

## 2019-12-12 LAB — SARS CORONAVIRUS 2 (TAT 6-24 HRS): SARS Coronavirus 2: NEGATIVE

## 2020-02-01 ENCOUNTER — Encounter: Payer: 59 | Admitting: Internal Medicine

## 2020-03-09 ENCOUNTER — Other Ambulatory Visit: Payer: Self-pay | Admitting: Internal Medicine

## 2020-03-09 DIAGNOSIS — F5101 Primary insomnia: Secondary | ICD-10-CM

## 2020-03-09 NOTE — Telephone Encounter (Signed)
Requested medications are due for refill today yes  Requested medications are on the active medication list yes  Last refill 9/16  Last visit 01/2019  Future visit scheduled 03/2020  Notes to clinic Failed protocol due to no valid visit within 6  months.

## 2020-03-12 ENCOUNTER — Other Ambulatory Visit: Payer: Self-pay | Admitting: Internal Medicine

## 2020-03-12 DIAGNOSIS — E039 Hypothyroidism, unspecified: Secondary | ICD-10-CM

## 2020-03-12 NOTE — Telephone Encounter (Signed)
Requested medication (s) are due for refill today: yes  Requested medication (s) are on the active medication list: yes  Last refill:  02/07/19  Future visit scheduled: yes  Notes to clinic:  overdue lab work   Requested Prescriptions  Pending Prescriptions Disp Refills   levothyroxine (SYNTHROID) 100 MCG tablet [Pharmacy Med Name: LEVOTHYROXINE 100 MCG TABLET] 90 tablet 3    Sig: TAKE 1 TABLET BY MOUTH DAILY BEFORE BREAKFAST.      Endocrinology:  Hypothyroid Agents Failed - 03/12/2020  9:02 AM      Failed - TSH needs to be rechecked within 3 months after an abnormal result. Refill until TSH is due.      Failed - TSH in normal range and within 360 days    TSH  Date Value Ref Range Status  01/28/2019 0.046 (L) 0.450 - 4.500 uIU/mL Final          Failed - Valid encounter within last 12 months    Recent Outpatient Visits           1 year ago Annual physical exam   [facility] Reubin Milan, MD   1 year ago Insomnia   [facility] Reubin Milan, MD   2 years ago Annual physical exam   [facility] Reubin Milan, MD   3 years ago Annual physical exam   [facility] Reubin Milan, MD   3 years ago Pain of both wrist joints   [facility] Reubin Milan, MD       Future Appointments             In 3 weeks Judithann Graves Nyoka Cowden, MD [facility]

## 2020-04-06 ENCOUNTER — Other Ambulatory Visit (HOSPITAL_COMMUNITY)
Admission: RE | Admit: 2020-04-06 | Discharge: 2020-04-06 | Disposition: A | Discharge status: Home / Self Care | Payer: BC Managed Care – PPO | Source: Ambulatory Visit | Attending: Internal Medicine | Admitting: Internal Medicine

## 2020-04-06 ENCOUNTER — Encounter: Payer: Self-pay | Admitting: Internal Medicine

## 2020-04-06 ENCOUNTER — Other Ambulatory Visit: Payer: Self-pay

## 2020-04-06 ENCOUNTER — Ambulatory Visit (INDEPENDENT_AMBULATORY_CARE_PROVIDER_SITE_OTHER): Payer: BC Managed Care – PPO | Admitting: Internal Medicine

## 2020-04-06 VITALS — BP 112/82 | HR 92 | Temp 98.3°F | Ht 64.0 in | Wt 191.0 lb

## 2020-04-06 DIAGNOSIS — E782 Mixed hyperlipidemia: Secondary | ICD-10-CM

## 2020-04-06 DIAGNOSIS — Z0000 Encounter for general adult medical examination without abnormal findings: Secondary | ICD-10-CM

## 2020-04-06 DIAGNOSIS — Z1159 Encounter for screening for other viral diseases: Secondary | ICD-10-CM | POA: Diagnosis not present

## 2020-04-06 DIAGNOSIS — E039 Hypothyroidism, unspecified: Secondary | ICD-10-CM | POA: Diagnosis not present

## 2020-04-06 DIAGNOSIS — Z1231 Encounter for screening mammogram for malignant neoplasm of breast: Secondary | ICD-10-CM

## 2020-04-06 DIAGNOSIS — Z124 Encounter for screening for malignant neoplasm of cervix: Secondary | ICD-10-CM | POA: Diagnosis not present

## 2020-04-06 DIAGNOSIS — F422 Mixed obsessional thoughts and acts: Secondary | ICD-10-CM

## 2020-04-06 DIAGNOSIS — F5101 Primary insomnia: Secondary | ICD-10-CM

## 2020-04-06 MED ORDER — ZOLPIDEM TARTRATE 5 MG PO TABS: ORAL_TABLET | ORAL | 5 refills | Status: DC

## 2020-04-06 MED ORDER — ESCITALOPRAM OXALATE 10 MG PO TABS: 10.0000 mg | ORAL_TABLET | Freq: Every day | ORAL | 3 refills | Status: DC

## 2020-04-06 NOTE — Progress Notes (Signed)
Date:  04/06/2020   Name:  Robin Madden   DOB:  [birth year]   MRN:  [id number]   Chief Complaint: Annual Exam (Breast exam and pap)  Robin Madden is a 55 y.o. female who presents today for her Complete Annual Exam. She feels fairly well. She reports exercising none. She reports she is sleeping well. Breast complaints none. She is enjoying her new job.  Mammogram: 04/2019 Pap smear: 08/2015 Colonoscopy: Cologuard 09/2017  Immunization History  Administered Date(s) Administered  . Influenza,inj,Quad PF,6+ Mos 04/04/2015, 01/12/2016, 01/06/2019, 02/02/2020  . Influenza-Unspecified 01/06/2019  . Moderna Sars-Covid-2 Vaccination 06/07/2019, 07/08/2019, 02/02/2020  . Tdap 09/20/2015  . Zoster Recombinat (Shingrix) 10/03/2018, 01/06/2019    Thyroid Problem Presents for follow-up visit. Patient reports no anxiety, constipation, diarrhea, fatigue, palpitations or tremors. The symptoms have been stable.  Insomnia Primary symptoms: sleep disturbance.  The problem occurs nightly. Past treatments include medication. The treatment provided significant relief.  OCD - on Lexapro and doing fairly well.  No side effects.  Noticing some more obsessive symptoms.   Lab Results  Component Value Date   CREATININE 0.75 01/28/2019   BUN 9 01/28/2019   NA 139 01/28/2019   K 4.3 01/28/2019   CL 103 01/28/2019   CO2 25 01/28/2019   Lab Results  Component Value Date   CHOL 266 (H) 01/28/2019   HDL 58 01/28/2019   LDLCALC 181 (H) 01/28/2019   TRIG 150 (H) 01/28/2019   CHOLHDL 4.6 (H) 01/28/2019   Lab Results  Component Value Date   TSH 0.046 (L) 01/28/2019   No results found for: HGBA1C Lab Results  Component Value Date   WBC 2.9 (L) 01/28/2019   HGB 12.6 01/28/2019   HCT 36.1 01/28/2019   MCV 98 (H) 01/28/2019   PLT 201 01/28/2019   Lab Results  Component Value Date   ALT 16 01/28/2019   AST 19 01/28/2019   ALKPHOS 58 01/28/2019   BILITOT 0.4 01/28/2019     Review  of Systems  Constitutional: Negative for chills, fatigue and fever.  HENT: Negative for congestion, hearing loss, tinnitus, trouble swallowing and voice change.   Eyes: Negative for visual disturbance.  Respiratory: Negative for cough, chest tightness, shortness of breath and wheezing.   Cardiovascular: Negative for chest pain, palpitations and leg swelling.  Gastrointestinal: Negative for abdominal pain, constipation, diarrhea and vomiting.  Endocrine: Negative for polydipsia and polyuria.  Genitourinary: Negative for dysuria, frequency, genital sores, vaginal bleeding and vaginal discharge.  Musculoskeletal: Negative for arthralgias, gait problem and joint swelling.  Skin: Negative for color change and rash.  Neurological: Positive for headaches (X1 day ). Negative for dizziness, tremors and light-headedness.  Hematological: Negative for adenopathy. Does not bruise/bleed easily.  Psychiatric/Behavioral: Positive for sleep disturbance. Negative for dysphoric mood. The patient has insomnia. The patient is not nervous/anxious.     Patient Active Problem List   Diagnosis Date Noted  . Mixed hyperlipidemia 10/06/2017  . Carpal tunnel syndrome on right 09/20/2016  . 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 Bilateral wrist pain 07/18/2016  . Acquired hypothyroidism 04/04/2015  . OCD (obsessive compulsive disorder) 04/04/2015  . 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 Primary insomnia 04/04/2015  . Abnormal WBC count 04/04/2015  . Macrocytosis 04/04/2015  . Hx of abnormal cervical Pap smear 04/04/2015    No Known Allergies  Past Surgical History:  Procedure Laterality Date  . CARPAL TUNNEL RELEASE Right 02/2017  . TONSILLECTOMY    . ULNAR  COLLATERAL LIGAMENT REPAIR Right 09/06/2016   Procedure: RIGHT ULNA NERVE TRANSPORTATION;  Surgeon: Erin Sons, MD;  Location: [facility];  Service: Orthopedics;  Laterality: Right;    Social History   Tobacco Use  . Smoking status:  Never Smoker  . Smokeless tobacco: Never Used  Vaping Use  . Vaping Use: Never used  Substance Use Topics  . Alcohol use: Yes    Alcohol/week: 14.0 standard drinks    Types: 7 Glasses of wine, 7 Shots of liquor per week    Comment: daily  . Drug use: No     Medication list has been reviewed and updated.  Current Meds  Medication Sig  . escitalopram (LEXAPRO) 10 MG tablet Take 1 tablet (10 mg total) by mouth daily.  . Folic Acid-Vit B6-Vit B12 (FOLBEE) 2.5-25-1 MG TABS tablet Take 1 tablet by mouth daily.  Marland Kitchen gabapentin (NEURONTIN) 300 MG capsule Take 1 capsule (300 mg total) by mouth 2 (two) times daily. (Patient taking differently: Take 300 mg by mouth as needed.)  . levothyroxine (SYNTHROID) 100 MCG tablet TAKE 1 TABLET BY MOUTH DAILY BEFORE BREAKFAST.  . Multiple Vitamins-Minerals (MULTIVITAMIN WITH MINERALS) tablet Take 1 tablet by mouth daily.  . traZODone (DESYREL) 50 MG tablet TAKE 1 TABLET (50 MG TOTAL) BY MOUTH AT BEDTIME AS NEEDED FOR SLEEP.  Marland Kitchen zolpidem (AMBIEN) 5 MG tablet TAKE 1 TABLET BY MOUTH EVERY DAY AT BEDTIME AS NEEDED FOR SLEEP    PHQ 2/9 Scores 04/06/2020 01/28/2019 09/23/2018 10/03/2017  PHQ - 2 Score 0 1 0 0  PHQ- 9 Score 0 - 0 -    GAD 7 : Generalized Anxiety Score 04/06/2020  Nervous, Anxious, on Edge 0  Control/stop worrying 0  Worry too much - different things 0  Trouble relaxing 0  Restless 0  Easily annoyed or irritable 0  Afraid - awful might happen 0  Total GAD 7 Score 0    BP Readings from Last 3 Encounters:  04/06/20 112/82  12/11/19 (!) 145/80  01/28/19 128/64    Physical Exam Vitals and nursing note reviewed.  Constitutional:      General: She is not in acute distress.    Appearance: She is well-developed.  HENT:     Head: Normocephalic and atraumatic.     Right Ear: Tympanic membrane and ear canal normal.     Left Ear: Tympanic membrane and ear canal normal.     Nose:     Right Sinus: No maxillary sinus tenderness.     Left Sinus:  No maxillary sinus tenderness.  Eyes:     General: No scleral icterus.       Right eye: No discharge.        Left eye: No discharge.     Conjunctiva/sclera: Conjunctivae normal.  Neck:     Thyroid: No thyroid mass, thyromegaly or thyroid tenderness.     Vascular: No carotid bruit.  Cardiovascular:     Rate and Rhythm: Normal rate and regular rhythm.  No extrasystoles are present.    Pulses: Normal pulses.     Heart sounds: Normal heart sounds.  Pulmonary:     Effort: Pulmonary effort is normal. No respiratory distress.     Breath sounds: Normal breath sounds. No wheezing.  Chest:  Breasts:     Right: No mass, nipple discharge, skin change or tenderness.     Left: No mass, nipple discharge, skin change or tenderness.    Abdominal:     General: Bowel sounds are  normal.     Palpations: Abdomen is soft.     Tenderness: There is no abdominal tenderness.  Genitourinary:    Labia:        Right: No tenderness, lesion or injury.        Left: No tenderness, lesion or injury.      Vagina: Normal.     Cervix: Normal.     Uterus: Normal.      Adnexa: Right adnexa normal and left adnexa normal.  Musculoskeletal:     Cervical back: Normal range of motion. No erythema.     Right lower leg: No edema.     Left lower leg: No edema.  Lymphadenopathy:     Cervical: No cervical adenopathy.  Skin:    General: Skin is warm and dry.     Findings: No rash.  Neurological:     Mental Status: She is alert and oriented to person, place, and time.     Cranial Nerves: No cranial nerve deficit.     Sensory: No sensory deficit.     Deep Tendon Reflexes: Reflexes are normal and symmetric.  Psychiatric:        Attention and Perception: Attention normal.        Mood and Affect: Mood normal.     Wt Readings from Last 3 Encounters:  04/06/20 191 lb (86.6 kg)  12/11/19 154 lb 15.7 oz (70.3 kg)  01/28/19 155 lb (70.3 kg)    BP 112/82   Pulse 92   Temp 98.3 F (36.8 C) (Oral)   Ht 5\' 4"  (1.626  m)   Wt 191 lb (86.6 kg)   LMP 07/15/2017 (Exact Date)   SpO2 97%   BMI 32.79 kg/m   Assessment and Plan: 1. Annual physical exam Exam is normal except for weight. Encourage regular exercise and appropriate dietary changes. - CBC with Differential/Platelet - Comprehensive metabolic panel  2. Encounter for screening for cervical cancer Obtained today - Cytology - PAP  3. Mixed obsessional thoughts and acts Continue Lexapro daily If sx worsening, return sooner, otherwise 6 months  4. Mixed hyperlipidemia Continue to work on low fat diet - Lipid panel  5. Primary insomnia Continue Trazodone nightly ambien PRN only  zolpidem (AMBIEN) 5 MG tablet; TAKE 1 TABLET BY MOUTH EVERY DAY AT BEDTIME AS NEEDED FOR SLEEP  Dispense: 30 tablet; Refill: 5  6. Need for hepatitis C screening test - Hepatitis C antibody  7. Acquired hypothyroidism supplemented - TSH + free T4  8. Breast cancer screening by mammogram Schedule at [facility] - MM 3D Screening Bilateral; Future  9. OCD (obsessive compulsive disorder) - escitalopram (LEXAPRO) 10 MG tablet; Take 1 tablet (10 mg total) by mouth daily.  Dispense: 90 tablet; Refill: 3    Partially dictated using [facility]. Any errors are unintentional.  Animal nutritionist, MD [facility]  04/06/2020

## 2020-04-07 LAB — CBC WITH DIFFERENTIAL/PLATELET
Basophils Absolute: 0 10*3/uL (ref 0.0–0.2)
Basos: 1 %
EOS (ABSOLUTE): 0.1 10*3/uL (ref 0.0–0.4)
Eos: 3 %
Hematocrit: 37.6 % (ref 34.0–46.6)
Hemoglobin: 13 g/dL (ref 11.1–15.9)
Immature Grans (Abs): 0 10*3/uL (ref 0.0–0.1)
Immature Granulocytes: 0 %
Lymphocytes Absolute: 1.4 10*3/uL (ref 0.7–3.1)
Lymphs: 49 %
MCH: 36 pg — ABNORMAL HIGH (ref 26.6–33.0)
MCHC: 34.6 g/dL (ref 31.5–35.7)
MCV: 104 fL — ABNORMAL HIGH (ref 79–97)
Monocytes Absolute: 0.3 10*3/uL (ref 0.1–0.9)
Monocytes: 10 %
Neutrophils Absolute: 1 10*3/uL — ABNORMAL LOW (ref 1.4–7.0)
Neutrophils: 37 %
Platelets: 224 10*3/uL (ref 150–450)
RBC: 3.61 x10E6/uL — ABNORMAL LOW (ref 3.77–5.28)
RDW: 11.2 % — ABNORMAL LOW (ref 11.7–15.4)
WBC: 2.8 10*3/uL — ABNORMAL LOW (ref 3.4–10.8)

## 2020-04-07 LAB — COMPREHENSIVE METABOLIC PANEL
ALT: 48 IU/L — ABNORMAL HIGH (ref 0–32)
AST: 45 IU/L — ABNORMAL HIGH (ref 0–40)
Albumin/Globulin Ratio: 1.9 (ref 1.2–2.2)
Albumin: 4.6 g/dL (ref 3.8–4.9)
Alkaline Phosphatase: 55 IU/L (ref 44–121)
BUN/Creatinine Ratio: 14 (ref 9–23)
BUN: 10 mg/dL (ref 6–24)
Bilirubin Total: 0.3 mg/dL (ref 0.0–1.2)
CO2: 22 mmol/L (ref 20–29)
Calcium: 9.6 mg/dL (ref 8.7–10.2)
Chloride: 103 mmol/L (ref 96–106)
Creatinine, Ser: 0.74 mg/dL (ref 0.57–1.00)
GFR calc Af Amer: 105 mL/min/{1.73_m2} (ref >59)
GFR calc non Af Amer: 91 mL/min/{1.73_m2} (ref >59)
Globulin, Total: 2.4 g/dL (ref 1.5–4.5)
Glucose: 114 mg/dL — ABNORMAL HIGH (ref 65–99)
Potassium: 4.4 mmol/L (ref 3.5–5.2)
Sodium: 141 mmol/L (ref 134–144)
Total Protein: 7 g/dL (ref 6.0–8.5)

## 2020-04-07 LAB — LIPID PANEL
Chol/HDL Ratio: 4.7 ratio — ABNORMAL HIGH (ref 0.0–4.4)
Cholesterol, Total: 277 mg/dL — ABNORMAL HIGH (ref 100–199)
HDL: 59 mg/dL (ref >39)
LDL Chol Calc (NIH): 195 mg/dL — ABNORMAL HIGH (ref 0–99)
Triglycerides: 127 mg/dL (ref 0–149)
VLDL Cholesterol Cal: 23 mg/dL (ref 5–40)

## 2020-04-07 LAB — TSH+FREE T4
Free T4: 1.55 ng/dL (ref 0.82–1.77)
TSH: 0.282 u[IU]/mL — ABNORMAL LOW (ref 0.450–4.500)

## 2020-04-07 LAB — HEPATITIS C ANTIBODY: Hep C Virus Ab: <0.1 s/co ratio (ref 0.0–0.9)

## 2020-04-11 LAB — CYTOLOGY - PAP
Comment: NEGATIVE
Diagnosis: NEGATIVE
Diagnosis: REACTIVE
High risk HPV: NEGATIVE

## 2020-05-10 ENCOUNTER — Ambulatory Visit
Admission: RE | Admit: 2020-05-10 | Discharge: 2020-05-10 | Disposition: A | Discharge status: Home / Self Care | Payer: BC Managed Care – PPO | Source: Ambulatory Visit | Attending: Internal Medicine | Admitting: Internal Medicine

## 2020-05-10 ENCOUNTER — Other Ambulatory Visit: Payer: Self-pay

## 2020-05-10 DIAGNOSIS — Z1231 Encounter for screening mammogram for malignant neoplasm of breast: Secondary | ICD-10-CM | POA: Insufficient documentation

## 2020-06-01 ENCOUNTER — Other Ambulatory Visit: Payer: Self-pay | Admitting: Internal Medicine

## 2020-06-01 DIAGNOSIS — F5101 Primary insomnia: Secondary | ICD-10-CM

## 2020-06-17 ENCOUNTER — Other Ambulatory Visit: Payer: Self-pay | Admitting: Internal Medicine

## 2020-06-17 DIAGNOSIS — E039 Hypothyroidism, unspecified: Secondary | ICD-10-CM

## 2020-06-17 NOTE — Telephone Encounter (Signed)
Most recent TSH performed on 04/06/20. Providing medication until next scheduled appointment.

## 2020-09-05 ENCOUNTER — Ambulatory Visit
Admission: EM | Admit: 2020-09-05 | Discharge: 2020-09-05 | Disposition: A | Discharge status: Home / Self Care | Payer: BC Managed Care – PPO | Attending: Sports Medicine | Admitting: Sports Medicine

## 2020-09-05 ENCOUNTER — Ambulatory Visit (INDEPENDENT_AMBULATORY_CARE_PROVIDER_SITE_OTHER): Payer: BC Managed Care – PPO

## 2020-09-05 ENCOUNTER — Other Ambulatory Visit: Payer: Self-pay

## 2020-09-05 DIAGNOSIS — W19XXXA Unspecified fall, initial encounter: Secondary | ICD-10-CM

## 2020-09-05 DIAGNOSIS — S20212A Contusion of left front wall of thorax, initial encounter: Secondary | ICD-10-CM

## 2020-09-05 DIAGNOSIS — M954 Acquired deformity of chest and rib: Secondary | ICD-10-CM | POA: Diagnosis not present

## 2020-09-05 DIAGNOSIS — Z043 Encounter for examination and observation following other accident: Secondary | ICD-10-CM | POA: Diagnosis not present

## 2020-09-05 DIAGNOSIS — R0781 Pleurodynia: Secondary | ICD-10-CM

## 2020-09-05 MED ORDER — TRAMADOL HCL 50 MG PO TABS: 50.0000 mg | ORAL_TABLET | Freq: Four times a day (QID) | ORAL | 0 refills | Status: DC | PRN

## 2020-09-05 NOTE — ED Provider Notes (Signed)
[facility]    CSN: [id number] Arrival date & time: 09/05/20  1121      History   Chief Complaint Chief Complaint  Patient presents with   Rib Injury    left   Hip Pain    left    HPI [name] is a 55 y.o. female.   55 year old female who presents for evaluation of primarily left-sided rib pain.  She fell in the shower on August 27, 2020.  She has not sought out medical care.  She is quite point tender in the lower ribs midclavicular line a little bit posterior.  Little bit of discomfort with a deep breath.  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  She does go to the corporate office twice a week.  She went today but she was really unable to do her job so she came to the urgent care.  She essentially works from home.  She was reading online that there was not a lot but could be done for a broken rib which delayed her presentation.  She also fell on her left lateral hip and buttock area and notes that there is a bruise there but it is resolving and she is not really complaining of that much today.  There was no loss of consciousness after the fall.  No red flag signs or symptoms elicited on history.    Past Medical History:  Diagnosis Date   Abnormal WBC count    "has been that way forever, with no explanation" (low)   Anxiety    Atypical facial pain    right side   Complication of anesthesia    History of fracture of fibula 04/04/2015   Hypothyroid    OCD (obsessive compulsive disorder)    PONV (postoperative nausea and vomiting)    after tonsillectomy   Wears contact lenses     Patient Active Problem List   Diagnosis Date Noted   Mixed hyperlipidemia 10/06/2017   Carpal tunnel syndrome on right 09/20/2016   Neuritis of right ulnar nerve 08/22/2016   Bilateral wrist pain 07/18/2016   Acquired hypothyroidism 04/04/2015   OCD (obsessive compulsive disorder) 04/04/2015   Atypical facial pain 04/04/2015   Primary insomnia 04/04/2015    Abnormal WBC count 04/04/2015   Macrocytosis 04/04/2015   Hx of abnormal cervical Pap smear 04/04/2015    Past Surgical History:  Procedure Laterality Date   CARPAL TUNNEL RELEASE Right 02/2017   TONSILLECTOMY     ULNAR COLLATERAL LIGAMENT REPAIR Right 09/06/2016   Procedure: RIGHT ULNA NERVE TRANSPORTATION;  Surgeon: Erin SonsKernodle, Harold, MD;  [facility];  Service: Orthopedics;  Laterality: Right;    OB History   No obstetric history on file.      Home Medications    Prior to Admission medications   Medication Sig Start Date End Date Taking? Authorizing Provider  escitalopram (LEXAPRO) 10 MG tablet Take 1 tablet (10 mg total) by mouth daily. 04/06/20  Yes Reubin MilanBerglund, Laura H, MD  Folic Acid-Vit B6-Vit B12 (FOLBEE) 2.5-25-1 MG TABS tablet Take 1 tablet by mouth daily.   Yes [provider]  gabapentin (NEURONTIN) 300 MG capsule Take 1 capsule (300 mg total) by mouth 2 (two) times daily. Patient taking differently: Take 300 mg by mouth as needed. 01/28/19  Yes Reubin MilanBerglund, Laura H, MD  levothyroxine (SYNTHROID) 100 MCG tablet TAKE 1 TABLET BY MOUTH EVERY DAY BEFORE BREAKFAST 06/17/20  Yes Reubin MilanBerglund, Laura H, MD  Multiple Vitamins-Minerals (MULTIVITAMIN  WITH MINERALS) tablet Take 1 tablet by mouth daily.   Yes [provider]  traMADol (ULTRAM) 50 MG tablet Take 1 tablet (50 mg total) by mouth every 6 (six) hours as needed. 09/05/20  Yes Delton See, MD  zolpidem (AMBIEN) 5 MG tablet TAKE 1 TABLET BY MOUTH EVERY DAY AT BEDTIME AS NEEDED FOR SLEEP 04/06/20  Yes Reubin Milan, MD    Family History Family History  Problem Relation Age of Onset   Mental illness Mother    Melanoma Brother    CAD Brother    Breast cancer Neg Hx     Social History Social History   Tobacco Use   Smoking status: Never   Smokeless tobacco: Never  Vaping Use   Vaping Use: Never used  Substance Use Topics   Alcohol use: Yes    Alcohol/week: 14.0 standard drinks     Types: 7 Glasses of wine, 7 Shots of liquor per week    Comment: daily   Drug use: No     Allergies   Patient has no known allergies.   Review of Systems Review of Systems  Constitutional:  Positive for activity change. Negative for appetite change, chills, diaphoresis, fatigue and fever.  HENT:  Negative for congestion, ear pain, postnasal drip, rhinorrhea, sinus pressure, sinus pain, sneezing and sore throat.   Eyes:  Negative for pain.  Respiratory:  Negative for cough, chest tightness, shortness of breath and wheezing.   Cardiovascular:  Positive for chest pain. Negative for palpitations.       Left-sided rib and chest pain to palpation  Gastrointestinal:  Negative for abdominal pain, diarrhea, nausea and vomiting.  Genitourinary:  Negative for dysuria.  Musculoskeletal:  Negative for back pain, myalgias and neck pain.  Skin:  Negative for pallor, rash and wound.  Neurological:  Negative for dizziness, light-headedness and headaches.  All other systems reviewed and are negative.   Physical Exam Triage Vital Signs ED Triage Vitals  Enc Vitals Group     BP 09/05/20 1157 (!) 161/96     Pulse Rate 09/05/20 1157 75     Resp 09/05/20 1157 18     Temp 09/05/20 1157 98.3 F (36.8 C)     Temp Source 09/05/20 1157 Oral     SpO2 09/05/20 1157 98 %     Weight 09/05/20 1153 190 lb (86.2 kg)     Height 09/05/20 1153 5' 3.5" (1.613 m)     Head Circumference --      Peak Flow --      Pain Score 09/05/20 1152 10     Pain Loc --      Pain Edu? --      Excl. in GC? --    No data found.  Updated Vital Signs BP (!) 161/96 (BP Location: Left Arm)   Pulse 75   Temp 98.3 F (36.8 C) (Oral)   Resp 18   Ht 5' 3.5" (1.613 m)   Wt 86.2 kg   LMP 07/15/2017 (Exact Date)   SpO2 98%   BMI 33.13 kg/m   Visual Acuity Right Eye Distance:   Left Eye Distance:   Bilateral Distance:    Right Eye Near:   Left Eye Near:    Bilateral Near:     Physical Exam Vitals and nursing note  reviewed. Chaperone present: Exam abdominal without a chaperone as palpation was performed over the lower ribs on the left side with close on.  Constitutional:  General: She is not in acute distress.    Appearance: Normal appearance. She is not ill-appearing, toxic-appearing or diaphoretic.  HENT:     Head: Normocephalic and atraumatic.     Nose: Nose normal.     Mouth/Throat:     Mouth: Mucous membranes are moist.  Eyes:     Conjunctiva/sclera: Conjunctivae normal.     Pupils: Pupils are equal, round, and reactive to light.  Cardiovascular:     Rate and Rhythm: Normal rate and regular rhythm.     Pulses: Normal pulses.     Heart sounds: Normal heart sounds. No murmur heard.   No friction rub. No gallop.  Pulmonary:     Effort: Pulmonary effort is normal.     Breath sounds: Normal breath sounds. No stridor. No wheezing, rhonchi or rales.  Chest:     Chest wall: Tenderness present. No lacerations, deformity or swelling.  Musculoskeletal:     Cervical back: Normal range of motion and neck supple.  Skin:    General: Skin is warm and dry.     Capillary Refill: Capillary refill takes less than 2 seconds.  Neurological:     General: No focal deficit present.     Mental Status: She is alert and oriented to person, place, and time.     UC Treatments / Results  Labs (all labs ordered are listed, but only abnormal results are displayed) Labs Reviewed - No data to display  EKG   Radiology DG Ribs Unilateral W/Chest Left  Result Date: 09/05/2020 CLINICAL DATA:  Fall EXAM: LEFT RIBS AND CHEST - 3+ VIEW COMPARISON:  None. FINDINGS: Unchanged cardiomediastinal silhouette. There is no focal airspace disease. There is no large pleural effusion or visible pneumothorax. There are posterior tenth, eleventh, and twelfth rib deformities which appear subacute-chronic. IMPRESSION: Posterior tenth, eleventh, and twelfth rib deformities which appear subacute-chronic, correlate with point  tenderness. Electronically Signed   By: Caprice Renshaw   On: 09/05/2020 12:26    Procedures Procedures (including critical care time)  Medications Ordered in UC Medications - No data to display  Initial Impression / Assessment and Plan / UC Course  I have reviewed the triage vital signs and the nursing notes.  Pertinent labs & imaging results that were available during my care of the patient were reviewed by me and considered in my medical decision making (see chart for details).  Clinical impression: 1.  Left-sided rib pain status post fall in the shower on August 27, 2020.  Possible rib fracture versus contusion.  Treatment plan: 1.  The findings and treatment plan were discussed in detail with the patient.  Patient was in agreement. 2.  We will get a left-sided rib x-ray series.  Report as above.  Shows posterior 10th, 11th, 12th rib deformities which appear to be chronic. 3.  Prescribed an incentive spirometer to decrease the likelihood of contracting pneumonia or atelectasis. 4.  Also prescribed tramadol that she can use as needed. 5.  Educational handouts provided. 6.  Tylenol or Motrin or ibuprofen for any discomfort. 7.  Gave her a work note just saying she was seen today and she can work from home. 8.  She was discharged in stable condition and she will follow-up here as needed.  Certainly if her symptoms were to worsen I advised her to go to ER and she voiced verbal understanding.    Final Clinical Impressions(s) / UC Diagnoses   Final diagnoses:  Rib pain on left side  Fall, initial  encounter  Rib contusion, left, initial encounter     Discharge Instructions      As we discussed, your chest x-ray does not show a new fracture to your ribs. I prescribed an incentive spirometer for you to use so that you do not develop a pneumonia. Also prescribed some tramadol for your discomfort. I gave you a work note indicating you were seen today. Please see educational handouts. He  can use Tylenol or Motrin, or Advil over-the-counter as needed. If symptoms persist please go to the ER. You should follow-up with your primary care physician in 1 or 2 weeks for recheck.     ED Prescriptions     Medication Sig Dispense Auth. Provider   traMADol (ULTRAM) 50 MG tablet Take 1 tablet (50 mg total) by mouth every 6 (six) hours as needed. 15 tablet Delton See, MD      I have reviewed the PDMP during this encounter.   Delton See, MD 09/05/20 1321

## 2020-09-05 NOTE — Discharge Instructions (Addendum)
As we discussed, your chest x-ray does not show a new fracture to your ribs. I prescribed an incentive spirometer for you to use so that you do not develop a pneumonia. Also prescribed some tramadol for your discomfort. I gave you a work note indicating you were seen today. Please see educational handouts. He can use Tylenol or Motrin, or Advil over-the-counter as needed. If symptoms persist please go to the ER. You should follow-up with your primary care physician in 1 or 2 weeks for recheck.

## 2020-09-05 NOTE — ED Triage Notes (Signed)
Pt c/o fall while in the shower, a little over a week ago. Pt reports pain to her left ribs that radiates into her back, along with left hip pain. Pt states the pain is not improving and she has pain when taking a deep breath or sneezing. Pt denies any LOC after fall or any other injuries.

## 2020-09-14 ENCOUNTER — Encounter: Payer: Self-pay | Admitting: Internal Medicine

## 2020-09-18 ENCOUNTER — Ambulatory Visit: Payer: BC Managed Care – PPO | Admitting: Internal Medicine

## 2020-09-20 ENCOUNTER — Ambulatory Visit: Payer: BC Managed Care – PPO | Admitting: Internal Medicine

## 2020-09-22 ENCOUNTER — Other Ambulatory Visit: Payer: Self-pay | Admitting: Internal Medicine

## 2020-09-22 DIAGNOSIS — E039 Hypothyroidism, unspecified: Secondary | ICD-10-CM

## 2020-10-18 ENCOUNTER — Encounter: Payer: Self-pay | Admitting: Internal Medicine

## 2020-10-19 ENCOUNTER — Other Ambulatory Visit: Payer: Self-pay

## 2020-10-19 DIAGNOSIS — F5101 Primary insomnia: Secondary | ICD-10-CM

## 2020-10-19 MED ORDER — TRAZODONE HCL 50 MG PO TABS: 50.0000 mg | ORAL_TABLET | Freq: Every evening | ORAL | 0 refills | Status: DC | PRN

## 2021-03-30 ENCOUNTER — Other Ambulatory Visit: Payer: Self-pay | Admitting: Internal Medicine

## 2021-03-30 DIAGNOSIS — E039 Hypothyroidism, unspecified: Secondary | ICD-10-CM

## 2021-03-30 NOTE — Telephone Encounter (Signed)
Requested medication (s) are due for refill today: yes  Requested medication (s) are on the active medication list: yes  Last refill:  09/22/20 #90 1 refill  Future visit scheduled: yes  Notes to clinic:  overdue TSH recheck. Due 07/05/20. Do you want to refill Rx?     Requested Prescriptions  Pending Prescriptions Disp Refills   levothyroxine (SYNTHROID) 100 MCG tablet [Pharmacy Med Name: LEVOTHYROXINE 100 MCG TABLET] 90 tablet 1    Sig: TAKE 1 TABLET BY MOUTH EVERY DAY BEFORE BREAKFAST     Endocrinology:  Hypothyroid Agents Failed - 03/30/2021  1:43 AM      Failed - TSH needs to be rechecked within 3 months after an abnormal result. Refill until TSH is due.      Failed - TSH in normal range and within 360 days    TSH  Date Value Ref Range Status  04/06/2020 0.282 (L) 0.450 - 4.500 uIU/mL Final          Passed - Valid encounter within last 12 months    Recent Outpatient Visits           11 months ago Annual physical exam   [facility] Reubin Milan, MD   2 years ago Annual physical exam   [facility] Reubin Milan, MD   2 years ago Insomnia   [facility] Reubin Milan, MD   3 years ago Annual physical exam   [facility] Reubin Milan, MD   4 years ago Annual physical exam   [facility] Reubin Milan, MD       Future Appointments             In 1 month Judithann Graves Nyoka Cowden, MD [facility], [facility]

## 2021-04-09 ENCOUNTER — Other Ambulatory Visit: Payer: Self-pay | Admitting: Internal Medicine

## 2021-04-09 DIAGNOSIS — F422 Mixed obsessional thoughts and acts: Secondary | ICD-10-CM

## 2021-04-09 NOTE — Telephone Encounter (Signed)
Requested Prescriptions  Pending Prescriptions Disp Refills   escitalopram (LEXAPRO) 10 MG tablet [Pharmacy Med Name: ESCITALOPRAM 10 MG TABLET] 90 tablet 3    Sig: TAKE 1 TABLET BY MOUTH EVERY DAY     Psychiatry:  Antidepressants - SSRI Failed - 04/09/2021  1:40 AM      Failed - Valid encounter within last 6 months    Recent Outpatient Visits          1 year ago Annual physical exam   [facility] Reubin Milan, MD   2 years ago Annual physical exam   [facility] Reubin Milan, MD   2 years ago Insomnia   [facility] Reubin Milan, MD   3 years ago Annual physical exam   [facility] Reubin Milan, MD   4 years ago Annual physical exam   [facility] Reubin Milan, MD      Future Appointments            In 3 weeks Judithann Graves Nyoka Cowden, MD [facility], [facility]

## 2021-04-11 ENCOUNTER — Encounter: Payer: BC Managed Care – PPO | Admitting: Internal Medicine

## 2021-04-14 ENCOUNTER — Other Ambulatory Visit: Payer: Self-pay | Admitting: Internal Medicine

## 2021-04-14 DIAGNOSIS — E039 Hypothyroidism, unspecified: Secondary | ICD-10-CM

## 2021-04-14 NOTE — Telephone Encounter (Signed)
last RF 03/30/21 #30 needs office visit  Requested Prescriptions  Refused Prescriptions Disp Refills   levothyroxine (SYNTHROID) 100 MCG tablet [Pharmacy Med Name: LEVOTHYROXINE 100 MCG TABLET] 90 tablet 1    Sig: TAKE 1 TABLET BY MOUTH EVERY DAY BEFORE BREAKFAST     Endocrinology:  Hypothyroid Agents Failed - 04/14/2021  5:05 PM      Failed - TSH needs to be rechecked within 3 months after an abnormal result. Refill until TSH is due.      Failed - TSH in normal range and within 360 days    TSH  Date Value Ref Range Status  04/06/2020 0.282 (L) 0.450 - 4.500 uIU/mL Final         Failed - Valid encounter within last 12 months    Recent Outpatient Visits          1 year ago Annual physical exam   [facility] Reubin Milan, MD   2 years ago Annual physical exam   [facility] Reubin Milan, MD   2 years ago Insomnia   [facility] Reubin Milan, MD   3 years ago Annual physical exam   [facility] Reubin Milan, MD   4 years ago Annual physical exam   [facility] Reubin Milan, MD      Future Appointments            In 2 weeks Judithann Graves Nyoka Cowden, MD [facility], [facility]

## 2021-05-04 ENCOUNTER — Ambulatory Visit (INDEPENDENT_AMBULATORY_CARE_PROVIDER_SITE_OTHER): Payer: BC Managed Care – PPO | Admitting: Internal Medicine

## 2021-05-04 ENCOUNTER — Encounter: Payer: Self-pay | Admitting: Internal Medicine

## 2021-05-04 ENCOUNTER — Other Ambulatory Visit: Payer: Self-pay | Admitting: Internal Medicine

## 2021-05-04 ENCOUNTER — Other Ambulatory Visit: Payer: Self-pay

## 2021-05-04 VITALS — BP 122/84 | HR 72 | Ht 64.0 in | Wt 205.0 lb

## 2021-05-04 DIAGNOSIS — Z0000 Encounter for general adult medical examination without abnormal findings: Secondary | ICD-10-CM

## 2021-05-04 DIAGNOSIS — Z1231 Encounter for screening mammogram for malignant neoplasm of breast: Secondary | ICD-10-CM | POA: Diagnosis not present

## 2021-05-04 DIAGNOSIS — Z1211 Encounter for screening for malignant neoplasm of colon: Secondary | ICD-10-CM | POA: Diagnosis not present

## 2021-05-04 DIAGNOSIS — F5101 Primary insomnia: Secondary | ICD-10-CM

## 2021-05-04 DIAGNOSIS — G501 Atypical facial pain: Secondary | ICD-10-CM

## 2021-05-04 DIAGNOSIS — E039 Hypothyroidism, unspecified: Secondary | ICD-10-CM | POA: Diagnosis not present

## 2021-05-04 DIAGNOSIS — F422 Mixed obsessional thoughts and acts: Secondary | ICD-10-CM

## 2021-05-04 DIAGNOSIS — E782 Mixed hyperlipidemia: Secondary | ICD-10-CM | POA: Diagnosis not present

## 2021-05-04 MED ORDER — TRAZODONE HCL 50 MG PO TABS: 50.0000 mg | ORAL_TABLET | Freq: Every evening | ORAL | 1 refills | Status: DC | PRN

## 2021-05-04 MED ORDER — WEGOVY 0.25 MG/0.5ML ~~LOC~~ SOAJ: 0.2500 mg | SUBCUTANEOUS | 0 refills | Status: DC

## 2021-05-04 MED ORDER — ZOLPIDEM TARTRATE 5 MG PO TABS: ORAL_TABLET | ORAL | 5 refills | Status: DC

## 2021-05-04 MED ORDER — LEVOTHYROXINE SODIUM 100 MCG PO TABS: ORAL_TABLET | ORAL | 1 refills | Status: DC

## 2021-05-04 MED ORDER — GABAPENTIN 300 MG PO CAPS: 300.0000 mg | ORAL_CAPSULE | Freq: Every day | ORAL | 1 refills | Status: DC | PRN

## 2021-05-04 NOTE — Patient Instructions (Signed)
Wegovey injections for weight loss  Additional risk factors  Hyperlipidemia  BMI 35

## 2021-05-04 NOTE — Progress Notes (Signed)
Date:  05/04/2021   Name:  Robin Madden   DOB:  [birth year]   MRN:  [id number]   Chief Complaint: Annual Exam (Breast exam no pap ) Robin M Filbrun is a 56 y.o. female who presents today for her Complete Annual Exam. She feels well. She reports exercising none. She reports she is sleeping well. Breast complaints none.  She has a new job and loves it.   Mammogram: 04/2020 DEXA: none Pap smear: 03/2020 neg with co-testing Colonoscopy: Cologuard 2019  Immunization History  Administered Date(s) Administered   Influenza,inj,Quad PF,6+ Mos 04/04/2015, 01/12/2016, 01/06/2019, 02/02/2020, 01/10/2021   Influenza-Unspecified 01/06/2019   Moderna Covid-19 Vaccine Bivalent Booster 13yrs & up 01/10/2021   Moderna Sars-Covid-2 Vaccination 06/07/2019, 07/08/2019, 02/02/2020, 10/18/2020   Tdap 09/20/2015   Zoster Recombinat (Shingrix) 10/03/2018, 01/06/2019    Thyroid Problem Presents for follow-up visit. Patient reports no anxiety, constipation, diarrhea, fatigue, palpitations or tremors. The symptoms have been stable.  Depression        This is a chronic problem.  The onset quality is undetermined. The problem is unchanged.  Associated symptoms include no decreased concentration, no fatigue and no headaches.( OCD sx)  Past treatments include SSRIs - Selective serotonin reuptake inhibitors and other medications.  Previous treatment provided significant relief.  Past medical history includes thyroid problem.    Lab Results  Component Value Date   NA 141 04/06/2020   K 4.4 04/06/2020   CO2 22 04/06/2020   GLUCOSE 114 (H) 04/06/2020   BUN 10 04/06/2020   CREATININE 0.74 04/06/2020   CALCIUM 9.6 04/06/2020   GFRNONAA 91 04/06/2020   Lab Results  Component Value Date   CHOL 277 (H) 04/06/2020   HDL 59 04/06/2020   LDLCALC 195 (H) 04/06/2020   TRIG 127 04/06/2020   CHOLHDL 4.7 (H) 04/06/2020   Lab Results  Component Value Date   TSH 0.282 (L) 04/06/2020   No results found for:  HGBA1C Lab Results  Component Value Date   WBC 2.8 (L) 04/06/2020   HGB 13.0 04/06/2020   HCT 37.6 04/06/2020   MCV 104 (H) 04/06/2020   PLT 224 04/06/2020   Lab Results  Component Value Date   ALT 48 (H) 04/06/2020   AST 45 (H) 04/06/2020   ALKPHOS 55 04/06/2020   BILITOT 0.3 04/06/2020   Lab Results  Component Value Date   VD25OH 33.7 10/03/2017     Review of Systems  Constitutional:  Positive for unexpected weight change. Negative for chills, fatigue and fever.  HENT:  Negative for congestion, hearing loss, tinnitus, trouble swallowing and voice change.   Eyes:  Negative for visual disturbance.  Respiratory:  Negative for cough, chest tightness, shortness of breath and wheezing.   Cardiovascular:  Negative for chest pain, palpitations and leg swelling.  Gastrointestinal:  Negative for abdominal pain, constipation, diarrhea and vomiting.  Endocrine: Negative for polydipsia and polyuria.  Genitourinary:  Negative for dysuria, frequency, genital sores, vaginal bleeding and vaginal discharge.  Musculoskeletal:  Negative for arthralgias, gait problem and joint swelling.  Skin:  Negative for color change and rash.  Neurological:  Negative for dizziness, tremors, light-headedness and headaches.  Hematological:  Negative for adenopathy. Does not bruise/bleed easily.  Psychiatric/Behavioral:  Positive for depression. Negative for behavioral problems, decreased concentration, dysphoric mood and sleep disturbance. The patient is not nervous/anxious.    Patient Active Problem List   Diagnosis Date Noted   Mixed hyperlipidemia 10/06/2017   Carpal tunnel syndrome on right 09/20/2016  Neuritis of right ulnar nerve 08/22/2016   Bilateral wrist pain 07/18/2016   Acquired hypothyroidism 04/04/2015   OCD (obsessive compulsive disorder) 04/04/2015   Atypical facial pain 04/04/2015   Primary insomnia 04/04/2015   Abnormal WBC count 04/04/2015   Macrocytosis 04/04/2015   Hx of abnormal  cervical Pap smear 04/04/2015    No Known Allergies  Past Surgical History:  Procedure Laterality Date   CARPAL TUNNEL RELEASE Right 02/2017   TONSILLECTOMY     ULNAR COLLATERAL LIGAMENT REPAIR Right 09/06/2016   Procedure: RIGHT ULNA NERVE TRANSPORTATION;  Surgeon: Leanor Kail, MD;  Location: [facility];  Service: Orthopedics;  Laterality: Right;    Social History   Tobacco Use   Smoking status: Never   Smokeless tobacco: Never  Vaping Use   Vaping Use: Never used  Substance Use Topics   Alcohol use: Yes    Alcohol/week: 14.0 standard drinks    Types: 7 Glasses of wine, 7 Shots of liquor per week    Comment: daily   Drug use: No     Medication list has been reviewed and updated.  Current Meds  Medication Sig   escitalopram (LEXAPRO) 10 MG tablet TAKE 1 TABLET BY MOUTH EVERY DAY   Folic Acid-Vit Q000111Q 123456 (FOLBEE) 2.5-25-1 MG TABS tablet Take 1 tablet by mouth daily.   gabapentin (NEURONTIN) 300 MG capsule Take 1 capsule (300 mg total) by mouth 2 (two) times daily. (Patient taking differently: Take 300 mg by mouth as needed.)   levothyroxine (SYNTHROID) 100 MCG tablet TAKE 1 TABLET BY MOUTH EVERY DAY BEFORE BREAKFAST   traZODone (DESYREL) 50 MG tablet Take 1 tablet (50 mg total) by mouth at bedtime as needed. for sleep   zolpidem (AMBIEN) 5 MG tablet TAKE 1 TABLET BY MOUTH EVERY DAY AT BEDTIME AS NEEDED FOR SLEEP    PHQ 2/9 Scores 05/04/2021 04/06/2020 01/28/2019 09/23/2018  PHQ - 2 Score 0 0 1 0  PHQ- 9 Score 1 0 - 0    GAD 7 : Generalized Anxiety Score 05/04/2021 04/06/2020  Nervous, Anxious, on Edge 0 0  Control/stop worrying 0 0  Worry too much - different things 0 0  Trouble relaxing 0 0  Restless 0 0  Easily annoyed or irritable 0 0  Afraid - awful might happen 0 0  Total GAD 7 Score 0 0    BP Readings from Last 3 Encounters:  05/04/21 122/84  09/05/20 (!) 161/96  04/06/20 112/82    Physical Exam Vitals and nursing note reviewed.   Constitutional:      General: She is not in acute distress.    Appearance: She is well-developed.  HENT:     Head: Normocephalic and atraumatic.     Right Ear: Tympanic membrane and ear canal normal.     Left Ear: Tympanic membrane and ear canal normal.     Nose:     Right Sinus: No maxillary sinus tenderness.     Left Sinus: No maxillary sinus tenderness.  Eyes:     General: No scleral icterus.       Right eye: No discharge.        Left eye: No discharge.     Conjunctiva/sclera: Conjunctivae normal.  Neck:     Thyroid: No thyromegaly.     Vascular: No carotid bruit.  Cardiovascular:     Rate and Rhythm: Normal rate and regular rhythm.     Pulses: Normal pulses.     Heart sounds: Normal heart sounds.  Pulmonary:     Effort: Pulmonary effort is normal. No respiratory distress.     Breath sounds: No wheezing.  Chest:  Breasts:    Right: No mass, nipple discharge, skin change or tenderness.     Left: No mass, nipple discharge, skin change or tenderness.  Abdominal:     General: Bowel sounds are normal.     Palpations: Abdomen is soft.     Tenderness: There is no abdominal tenderness.  Musculoskeletal:     Cervical back: Normal range of motion. No erythema.     Right lower leg: No edema.     Left lower leg: No edema.  Lymphadenopathy:     Cervical: No cervical adenopathy.  Skin:    General: Skin is warm and dry.     Findings: No rash.  Neurological:     Mental Status: She is alert and oriented to person, place, and time.     Cranial Nerves: No cranial nerve deficit.     Sensory: No sensory deficit.     Deep Tendon Reflexes: Reflexes are normal and symmetric.  Psychiatric:        Attention and Perception: Attention normal.        Mood and Affect: Mood normal.    Wt Readings from Last 3 Encounters:  05/04/21 205 lb (93 kg)  09/05/20 190 lb (86.2 kg)  04/06/20 191 lb (86.6 kg)    BP 122/84    Pulse 72    Ht 5\' 4"  (1.626 m)    Wt 205 lb (93 kg)    LMP 07/15/2017  (Exact Date)    SpO2 96%    BMI 35.19 kg/m   Assessment and Plan: 1. Annual physical exam Exam is normal except for weight. Encourage regular exercise and appropriate dietary changes. She may be a candidate for Wegovy - she will check her insurance coverage - CBC with Differential/Platelet - Comprehensive metabolic panel - Hemoglobin A1c  2. Encounter for screening mammogram for breast cancer Schedule at [facility] - MM 3D SCREEN BREAST BILATERAL  3. Colon cancer screening - Cologuard  4. Acquired hypothyroidism supplemented - TSH + free T4 - levothyroxine (SYNTHROID) 100 MCG tablet; TAKE 1 TABLET BY MOUTH EVERY DAY BEFORE BREAKFAST  Dispense: 90 tablet; Refill: 1  5. Mixed obsessional thoughts and acts Doing well on Lexapro  6. Mixed hyperlipidemia Check labs and advise Low fat diet, exercise - Lipid panel  7. Primary insomnia - zolpidem (AMBIEN) 5 MG tablet; TAKE 1 TABLET BY MOUTH EVERY DAY AT BEDTIME AS NEEDED FOR SLEEP  Dispense: 30 tablet; Refill: 5 - traZODone (DESYREL) 50 MG tablet; Take 1 tablet (50 mg total) by mouth at bedtime as needed. for sleep  Dispense: 90 tablet; Refill: 1  8. Atypical facial pain Using gabapentin daily as needed - gabapentin (NEURONTIN) 300 MG capsule; Take 1 capsule (300 mg total) by mouth daily as needed.  Dispense: 90 capsule; Refill: 1   Partially dictated using Editor, commissioning. Any errors are unintentional.  Halina Maidens, MD [facility]  05/04/2021

## 2021-05-05 LAB — LIPID PANEL
Chol/HDL Ratio: 5.9 ratio — ABNORMAL HIGH (ref 0.0–4.4)
Cholesterol, Total: 302 mg/dL — ABNORMAL HIGH (ref 100–199)
HDL: 51 mg/dL (ref >39)
LDL Chol Calc (NIH): 227 mg/dL — ABNORMAL HIGH (ref 0–99)
Triglycerides: 133 mg/dL (ref 0–149)
VLDL Cholesterol Cal: 24 mg/dL (ref 5–40)

## 2021-05-05 LAB — COMPREHENSIVE METABOLIC PANEL
ALT: 71 IU/L — ABNORMAL HIGH (ref 0–32)
AST: 66 IU/L — ABNORMAL HIGH (ref 0–40)
Albumin/Globulin Ratio: 1.9 (ref 1.2–2.2)
Albumin: 4.6 g/dL (ref 3.8–4.9)
Alkaline Phosphatase: 79 IU/L (ref 44–121)
BUN/Creatinine Ratio: 14 (ref 9–23)
BUN: 11 mg/dL (ref 6–24)
Bilirubin Total: 0.5 mg/dL (ref 0.0–1.2)
CO2: 23 mmol/L (ref 20–29)
Calcium: 9.4 mg/dL (ref 8.7–10.2)
Chloride: 101 mmol/L (ref 96–106)
Creatinine, Ser: 0.77 mg/dL (ref 0.57–1.00)
Globulin, Total: 2.4 g/dL (ref 1.5–4.5)
Glucose: 108 mg/dL — ABNORMAL HIGH (ref 70–99)
Potassium: 4.4 mmol/L (ref 3.5–5.2)
Sodium: 139 mmol/L (ref 134–144)
Total Protein: 7 g/dL (ref 6.0–8.5)
eGFR: 90 mL/min/{1.73_m2} (ref >59)

## 2021-05-05 LAB — CBC WITH DIFFERENTIAL/PLATELET
Basophils Absolute: 0 10*3/uL (ref 0.0–0.2)
Basos: 1 %
EOS (ABSOLUTE): 0.1 10*3/uL (ref 0.0–0.4)
Eos: 2 %
Hematocrit: 39.5 % (ref 34.0–46.6)
Hemoglobin: 13.8 g/dL (ref 11.1–15.9)
Immature Grans (Abs): 0 10*3/uL (ref 0.0–0.1)
Immature Granulocytes: 1 %
Lymphocytes Absolute: 1.2 10*3/uL (ref 0.7–3.1)
Lymphs: 28 %
MCH: 37.3 pg — ABNORMAL HIGH (ref 26.6–33.0)
MCHC: 34.9 g/dL (ref 31.5–35.7)
MCV: 107 fL — ABNORMAL HIGH (ref 79–97)
Monocytes Absolute: 0.4 10*3/uL (ref 0.1–0.9)
Monocytes: 9 %
Neutrophils Absolute: 2.5 10*3/uL (ref 1.4–7.0)
Neutrophils: 59 %
Platelets: 178 10*3/uL (ref 150–450)
RBC: 3.7 x10E6/uL — ABNORMAL LOW (ref 3.77–5.28)
RDW: 11.8 % (ref 11.7–15.4)
WBC: 4.1 10*3/uL (ref 3.4–10.8)

## 2021-05-05 LAB — HEMOGLOBIN A1C
Est. average glucose Bld gHb Est-mCnc: 103 mg/dL
Hgb A1c MFr Bld: 5.2 % (ref 4.8–5.6)

## 2021-05-05 LAB — TSH+FREE T4
Free T4: 1.11 ng/dL (ref 0.82–1.77)
TSH: 1.54 u[IU]/mL (ref 0.450–4.500)

## 2021-05-07 ENCOUNTER — Telehealth: Payer: Self-pay

## 2021-05-07 NOTE — Telephone Encounter (Signed)
PA completed waiting on insurance approval.  Key: [id number]

## 2021-05-08 NOTE — Telephone Encounter (Signed)
Called pt left VM that Wegovy was approved. Pts name stated on VM.  [facility] nurse may give results to patient if they return call to clinic, a CRM has been created.   KP

## 2021-05-08 NOTE — Telephone Encounter (Signed)
Approved  Effective from 05/07/2021 through 09/09/2021  KP

## 2021-05-09 DIAGNOSIS — Z1211 Encounter for screening for malignant neoplasm of colon: Secondary | ICD-10-CM | POA: Diagnosis not present

## 2021-05-17 ENCOUNTER — Telehealth: Payer: Self-pay | Admitting: Internal Medicine

## 2021-05-17 LAB — COLOGUARD: COLOGUARD: NEGATIVE

## 2021-05-17 NOTE — Telephone Encounter (Signed)
Pt is calling back.  Pt did read her results in MyChart. Pt declined wanting to speak to a nurse at this time.

## 2021-05-17 NOTE — Telephone Encounter (Signed)
Noted  KP 

## 2021-06-06 ENCOUNTER — Ambulatory Visit: Payer: BC Managed Care – PPO | Admitting: Internal Medicine

## 2021-06-06 ENCOUNTER — Other Ambulatory Visit: Payer: Self-pay

## 2021-06-06 ENCOUNTER — Encounter: Payer: Self-pay | Admitting: Internal Medicine

## 2021-06-06 VITALS — BP 110/82 | HR 95 | Ht 64.0 in | Wt 199.8 lb

## 2021-06-06 DIAGNOSIS — E669 Obesity, unspecified: Secondary | ICD-10-CM | POA: Diagnosis not present

## 2021-06-06 MED ORDER — WEGOVY 0.5 MG/0.5ML ~~LOC~~ SOAJ: 0.5000 mg | SUBCUTANEOUS | 2 refills | Status: DC

## 2021-06-06 NOTE — Progress Notes (Signed)
? ? ?Date:  06/06/2021  ? ?Name:  Robin Madden   DOB:  10/10/1965   MRN:  [id number] ? ? ?Chief Complaint: Weight Loss ? ?Weight management - pt is here for weight management follow up.  Started on 05/04/21 on Wegovy.  Doing well without side effects.  Starting weight 205 lbs.   Today's weight 199.8 lbs.  Current diet is a lot less/ eating less calories and current exercise routine is - none at this time but plan to have a routine in the future. Patient would like to increase to the next dose today. ? ?HPI ? ?Lab Results  ?Component Value Date  ? NA 139 05/04/2021  ? K 4.4 05/04/2021  ? CO2 23 05/04/2021  ? GLUCOSE 108 (H) 05/04/2021  ? BUN 11 05/04/2021  ? CREATININE 0.77 05/04/2021  ? CALCIUM 9.4 05/04/2021  ? EGFR 90 05/04/2021  ? GFRNONAA 91 04/06/2020  ? ?Lab Results  ?Component Value Date  ? CHOL 302 (H) 05/04/2021  ? HDL 51 05/04/2021  ? LDLCALC 227 (H) 05/04/2021  ? TRIG 133 05/04/2021  ? CHOLHDL 5.9 (H) 05/04/2021  ? ?Lab Results  ?Component Value Date  ? TSH 1.540 05/04/2021  ? ?Lab Results  ?Component Value Date  ? HGBA1C 5.2 05/04/2021  ? ?Lab Results  ?Component Value Date  ? WBC 4.1 05/04/2021  ? HGB 13.8 05/04/2021  ? HCT 39.5 05/04/2021  ? MCV 107 (H) 05/04/2021  ? PLT 178 05/04/2021  ? ?Lab Results  ?Component Value Date  ? ALT 71 (H) 05/04/2021  ? AST 66 (H) 05/04/2021  ? ALKPHOS 79 05/04/2021  ? BILITOT 0.5 05/04/2021  ? ?Lab Results  ?Component Value Date  ? VD25OH 33.7 10/03/2017  ?  ? ?Review of Systems  ?Constitutional:  Negative for chills, fatigue and fever.  ?HENT:  Negative for trouble swallowing.   ?Respiratory:  Negative for chest tightness and shortness of breath.   ?Gastrointestinal:  Negative for abdominal pain, constipation, diarrhea, nausea and vomiting.  ?Neurological:  Negative for dizziness and light-headedness.  ? ?Patient Active Problem List  ? Diagnosis Date Noted  ? Mixed hyperlipidemia 10/06/2017  ? Carpal tunnel syndrome on right 09/20/2016  ? Neuritis of right ulnar  nerve 08/22/2016  ? Bilateral wrist pain 07/18/2016  ? Acquired hypothyroidism 04/04/2015  ? OCD (obsessive compulsive disorder) 04/04/2015  ? Atypical facial pain 04/04/2015  ? Primary insomnia 04/04/2015  ? Abnormal WBC count 04/04/2015  ? Macrocytosis 04/04/2015  ? Hx of abnormal cervical Pap smear 04/04/2015  ? ? ?No Known Allergies ? ?Past Surgical History:  ?Procedure Laterality Date  ? CARPAL TUNNEL RELEASE Right 02/2017  ? TONSILLECTOMY    ? ULNAR COLLATERAL LIGAMENT REPAIR Right 09/06/2016  ? Procedure: RIGHT ULNA NERVE TRANSPORTATION;  Surgeon: Leanor Kail, MD;  Location: [place];  Service: Orthopedics;  Laterality: Right;  ? ? ?Social History  ? ?Tobacco Use  ? Smoking status: Never  ? Smokeless tobacco: Never  ?Vaping Use  ? Vaping Use: Never used  ?Substance Use Topics  ? Alcohol use: Yes  ?  Alcohol/week: 14.0 standard drinks  ?  Types: 7 Glasses of wine, 7 Shots of liquor per week  ?  Comment: daily  ? Drug use: No  ? ? ? ?Medication list has been reviewed and updated. ? ?Current Meds  ?Medication Sig  ? escitalopram (LEXAPRO) 10 MG tablet TAKE 1 TABLET BY MOUTH EVERY DAY  ? Folic Acid-Vit B1-YOM A00 (FOLBEE) 2.5-25-1 MG TABS tablet  Take 1 tablet by mouth daily.  ? gabapentin (NEURONTIN) 300 MG capsule Take 1 capsule (300 mg total) by mouth daily as needed.  ? levothyroxine (SYNTHROID) 100 MCG tablet TAKE 1 TABLET BY MOUTH EVERY DAY BEFORE BREAKFAST  ? Multiple Vitamins-Minerals (MULTIVITAMIN WITH MINERALS) tablet Take 1 tablet by mouth daily.  ? traZODone (DESYREL) 50 MG tablet Take 1 tablet (50 mg total) by mouth at bedtime as needed. for sleep  ? zolpidem (AMBIEN) 5 MG tablet TAKE 1 TABLET BY MOUTH EVERY DAY AT BEDTIME AS NEEDED FOR SLEEP  ? [DISCONTINUED] Semaglutide-Weight Management (WEGOVY) 0.25 MG/0.5ML SOAJ Inject 0.25 mg into the skin once a week.  ? ? ?PHQ 2/9 Scores 06/06/2021 05/04/2021 04/06/2020 01/28/2019  ?PHQ - 2 Score 0 0 0 1  ?PHQ- 9 Score 2 1 0 -  ? ? ?GAD 7 :  Generalized Anxiety Score 06/06/2021 05/04/2021 04/06/2020  ?Nervous, Anxious, on Edge 0 0 0  ?Control/stop worrying 1 0 0  ?Worry too much - different things 0 0 0  ?Trouble relaxing 0 0 0  ?Restless 0 0 0  ?Easily annoyed or irritable 0 0 0  ?Afraid - awful might happen 0 0 0  ?Total GAD 7 Score 1 0 0  ? ? ?BP Readings from Last 3 Encounters:  ?06/06/21 110/82  ?05/04/21 122/84  ?09/05/20 (!) 161/96  ? ? ?Physical Exam ?Vitals and nursing note reviewed.  ?Constitutional:   ?   General: She is not in acute distress. ?   Appearance: She is well-developed.  ?HENT:  ?   Head: Normocephalic and atraumatic.  ?Cardiovascular:  ?   Rate and Rhythm: Normal rate and regular rhythm.  ?   Pulses: Normal pulses.  ?Pulmonary:  ?   Effort: Pulmonary effort is normal. No respiratory distress.  ?   Breath sounds: No wheezing or rhonchi.  ?Abdominal:  ?   General: Bowel sounds are normal. There is no distension.  ?   Palpations: Abdomen is soft. There is no mass.  ?   Tenderness: There is no abdominal tenderness.  ?Skin: ?   General: Skin is warm and dry.  ?   Findings: No rash.  ?Neurological:  ?   Mental Status: She is alert and oriented to person, place, and time.  ?Psychiatric:     ?   Mood and Affect: Mood normal.     ?   Behavior: Behavior normal.  ? ? ?Wt Readings from Last 3 Encounters:  ?06/06/21 199 lb 12.8 oz (90.6 kg)  ?05/04/21 205 lb (93 kg)  ?09/05/20 190 lb (86.2 kg)  ? ? ?BP 110/82   Pulse 95   Ht _0  (1.626 m)   Wt 199 lb 12.8 oz (90.6 kg)   LMP 07/15/2017 (Exact Date)   SpO2 97%   BMI 34.30 kg/m?  ? ?Assessment and Plan: ?1. Obesity (BMI 30.0-34.9) ?Excellent weight loss to the starting dose - 6 lbs total. ?Will increase to 0.5 mg weekly. ?Encourage better hydration and a regular exercise program. ?- Semaglutide-Weight Management (WEGOVY) 0.5 MG/0.5ML SOAJ; Inject 0.5 mg into the skin once a week.  Dispense: 2 mL; Refill: 2 ? ? ?Partially dictated using Editor, commissioning. Any errors are unintentional. ? ?Halina Maidens, MD ?[facility] ?[facility] ? ?06/06/2021 ? ? ? ? ?

## 2021-06-18 ENCOUNTER — Encounter: Payer: Self-pay | Admitting: Internal Medicine

## 2021-06-18 ENCOUNTER — Other Ambulatory Visit: Payer: Self-pay

## 2021-06-18 DIAGNOSIS — E669 Obesity, unspecified: Secondary | ICD-10-CM

## 2021-06-18 MED ORDER — SEMAGLUTIDE-WEIGHT MANAGEMENT 0.25 MG/0.5ML ~~LOC~~ SOAJ: 0.2500 mg | SUBCUTANEOUS | 0 refills | Status: DC

## 2021-06-21 ENCOUNTER — Other Ambulatory Visit: Payer: Self-pay | Admitting: Internal Medicine

## 2021-06-21 ENCOUNTER — Other Ambulatory Visit: Payer: Self-pay

## 2021-06-21 MED ORDER — SEMAGLUTIDE-WEIGHT MANAGEMENT 0.25 MG/0.5ML ~~LOC~~ SOAJ: 0.2500 mg | SUBCUTANEOUS | 0 refills | Status: DC

## 2021-06-21 NOTE — Telephone Encounter (Signed)
Receipt confirmed by pharmacy 06/21/21  At 8:15 am ? ?Requested Prescriptions  ?Pending Prescriptions Disp Refills  ?? WEGOVY 0.25 MG/0.5ML SOAJ [Pharmacy Med Name: WEGOVY 0.25 MG/0.5 ML PEN]  0  ?  Sig: INJECT 0.25MG  INTO THE SKIN ONE TIME PER WEEK  ?  ? Endocrinology:  Diabetes - GLP-1 Receptor Agonists - semaglutide Passed - 06/21/2021  8:53 AM  ?  ?  Passed - HBA1C in normal range and within 180 days  ?  Hgb A1c MFr Bld  ?Date Value Ref Range Status  ?05/04/2021 5.2 4.8 - 5.6 % Final  ?  Comment:  ?           Prediabetes: 5.7 - 6.4 ?         Diabetes: >6.4 ?         Glycemic control for adults with diabetes: <7.0 ?  ?   ?  ?  Passed - Cr in normal range and within 360 days  ?  Creatinine, Ser  ?Date Value Ref Range Status  ?05/04/2021 0.77 0.57 - 1.00 mg/dL Final  ?   ?  ?  Passed - Valid encounter within last 6 months  ?  Recent Outpatient Visits   ?      ? 2 weeks ago Obesity (BMI 30.0-34.9)  ? [facility] Glean Hess, MD  ? 1 month ago Annual physical exam  ? [facility] Glean Hess, MD  ? 1 year ago Annual physical exam  ? [facility] Glean Hess, MD  ? 2 years ago Annual physical exam  ? [facility] Glean Hess, MD  ? 2 years ago Insomnia  ? [facility] Glean Hess, MD  ?  ?  ?Future Appointments   ?        ? In 1 month Army Melia Jesse Sans, MD [facility] [place]  ?  ? ?  ?  ?  ? ? ?

## 2021-06-27 ENCOUNTER — Telehealth: Payer: Self-pay

## 2021-06-27 NOTE — Telephone Encounter (Signed)
Key: [id number] ? ?PA completed waiting on insurance approval. ? ?KP ?

## 2021-06-28 ENCOUNTER — Ambulatory Visit
Admission: RE | Admit: 2021-06-28 | Discharge: 2021-06-28 | Disposition: A | Discharge status: Home / Self Care | Payer: BC Managed Care – PPO | Source: Ambulatory Visit | Attending: Internal Medicine | Admitting: Internal Medicine

## 2021-06-28 DIAGNOSIS — Z1231 Encounter for screening mammogram for malignant neoplasm of breast: Secondary | ICD-10-CM | POA: Insufficient documentation

## 2021-06-28 NOTE — Telephone Encounter (Signed)
Approved 06/27/2021-07/27/2021 ? ?Pt informed via Mychart. ? ?KP ?

## 2021-07-31 ENCOUNTER — Other Ambulatory Visit: Payer: Self-pay | Admitting: Internal Medicine

## 2021-08-01 NOTE — Telephone Encounter (Signed)
Requested Prescriptions  ?Pending Prescriptions Disp Refills  ?? WEGOVY 0.25 MG/0.5ML SOAJ [Pharmacy Med Name: WEGOVY 0.25 MG/0.5 ML PEN] 2 mL 1  ?  Sig: INJECT 0.25MG  INTO THE SKIN ONE TIME PER WEEK  ?  ? Endocrinology:  Diabetes - GLP-1 Receptor Agonists - semaglutide Passed - 07/31/2021  6:19 AM  ?  ?  Passed - HBA1C in normal range and within 180 days  ?  Hgb A1c MFr Bld  ?Date Value Ref Range Status  ?05/04/2021 5.2 4.8 - 5.6 % Final  ?  Comment:  ?           Prediabetes: 5.7 - 6.4 ?         Diabetes: >6.4 ?         Glycemic control for adults with diabetes: <7.0 ?  ?   ?  ?  Passed - Cr in normal range and within 360 days  ?  Creatinine, Ser  ?Date Value Ref Range Status  ?05/04/2021 0.77 0.57 - 1.00 mg/dL Final  ?   ?  ?  Passed - Valid encounter within last 6 months  ?  Recent Outpatient Visits   ?      ? 1 month ago Obesity (BMI 30.0-34.9)  ? [facility] Reubin Milan, MD  ? 2 months ago Annual physical exam  ? [facility] Reubin Milan, MD  ? 1 year ago Annual physical exam  ? [facility] Reubin Milan, MD  ? 2 years ago Annual physical exam  ? [facility] Reubin Milan, MD  ? 2 years ago Insomnia  ? [facility] Reubin Milan, MD  ?  ?  ?Future Appointments   ?        ? In 5 days Reubin Milan, MD [facility]  ?  ? ?  ?  ?  ? ? ?

## 2021-08-02 ENCOUNTER — Telehealth: Payer: Self-pay

## 2021-08-02 ENCOUNTER — Encounter: Payer: Self-pay | Admitting: Internal Medicine

## 2021-08-02 NOTE — Telephone Encounter (Signed)
PA completed waiting for insurance approval. ? ?Key: [id number] ? ?KP ?

## 2021-08-05 ENCOUNTER — Other Ambulatory Visit: Payer: Self-pay | Admitting: Internal Medicine

## 2021-08-05 DIAGNOSIS — F422 Mixed obsessional thoughts and acts: Secondary | ICD-10-CM

## 2021-08-06 ENCOUNTER — Encounter: Payer: Self-pay | Admitting: Internal Medicine

## 2021-08-06 ENCOUNTER — Telehealth: Payer: Self-pay

## 2021-08-06 ENCOUNTER — Ambulatory Visit: Payer: BC Managed Care – PPO | Admitting: Internal Medicine

## 2021-08-06 VITALS — BP 128/70 | HR 75 | Ht 64.0 in | Wt 187.8 lb

## 2021-08-06 DIAGNOSIS — K5900 Constipation, unspecified: Secondary | ICD-10-CM

## 2021-08-06 DIAGNOSIS — Z6832 Body mass index (BMI) 32.0-32.9, adult: Secondary | ICD-10-CM

## 2021-08-06 DIAGNOSIS — R112 Nausea with vomiting, unspecified: Secondary | ICD-10-CM

## 2021-08-06 DIAGNOSIS — F422 Mixed obsessional thoughts and acts: Secondary | ICD-10-CM | POA: Diagnosis not present

## 2021-08-06 MED ORDER — SAXENDA 18 MG/3ML ~~LOC~~ SOPN: 0.6000 mg | PEN_INJECTOR | Freq: Every day | SUBCUTANEOUS | 0 refills | Status: DC

## 2021-08-06 MED ORDER — ESCITALOPRAM OXALATE 10 MG PO TABS: 10.0000 mg | ORAL_TABLET | Freq: Every day | ORAL | 3 refills | Status: DC

## 2021-08-06 MED ORDER — PEN NEEDLES 32G X 5 MM MISC: 1.0000 | Freq: Every day | 0 refills | Status: DC

## 2021-08-06 NOTE — Telephone Encounter (Signed)
Pt can only get 4 pins. Pt will needs to go on higher dosage after that. ? ?KP ?

## 2021-08-06 NOTE — Progress Notes (Signed)
? ? ? ?Date:  08/06/2021  ? ?Name:  Robin Madden   DOB:  07-29-1965   MRN:  [id number] ? ? ?Chief Complaint: Weight Check ?Weight management - pt is here for weight management follow up.  Started on [place] on 04/2021.  Doing well without side effects.  Starting weight 205 lbs.   Today's weight 187 lbs.  Current diet is reduced calorie and current exercise routine is walking up and down stairs.  She could not tolerate 0.5 mg due to vomiting so reduced dose back to 0.25 mg.  She still has some vomiting on the lowest dose. ?Depression ?       This is a chronic problem.The problem is unchanged.  Associated symptoms include no fatigue and no headaches.  Past treatments include SSRIs - Selective serotonin reuptake inhibitors.  Compliance with treatment is good.  Previous treatment provided significant relief. ? ? ?Lab Results  ?Component Value Date  ? NA 139 05/04/2021  ? K 4.4 05/04/2021  ? CO2 23 05/04/2021  ? GLUCOSE 108 (H) 05/04/2021  ? BUN 11 05/04/2021  ? CREATININE 0.77 05/04/2021  ? CALCIUM 9.4 05/04/2021  ? EGFR 90 05/04/2021  ? GFRNONAA 91 04/06/2020  ? ?Lab Results  ?Component Value Date  ? CHOL 302 (H) 05/04/2021  ? HDL 51 05/04/2021  ? LDLCALC 227 (H) 05/04/2021  ? TRIG 133 05/04/2021  ? CHOLHDL 5.9 (H) 05/04/2021  ? ?Lab Results  ?Component Value Date  ? TSH 1.540 05/04/2021  ? ?Lab Results  ?Component Value Date  ? HGBA1C 5.2 05/04/2021  ? ?Lab Results  ?Component Value Date  ? WBC 4.1 05/04/2021  ? HGB 13.8 05/04/2021  ? HCT 39.5 05/04/2021  ? MCV 107 (H) 05/04/2021  ? PLT 178 05/04/2021  ? ?Lab Results  ?Component Value Date  ? ALT 71 (H) 05/04/2021  ? AST 66 (H) 05/04/2021  ? ALKPHOS 79 05/04/2021  ? BILITOT 0.5 05/04/2021  ? ?Lab Results  ?Component Value Date  ? VD25OH 33.7 10/03/2017  ?  ? ?Review of Systems  ?Constitutional:  Negative for fatigue and unexpected weight change.  ?HENT:  Negative for nosebleeds.   ?Eyes:  Negative for visual disturbance.  ?Respiratory:  Negative for cough, chest  tightness, shortness of breath and wheezing.   ?Cardiovascular:  Negative for chest pain, palpitations and leg swelling.  ?Gastrointestinal:  Positive for constipation, nausea and vomiting. Negative for abdominal pain and diarrhea.  ?Neurological:  Negative for dizziness, weakness, light-headedness and headaches.  ?Psychiatric/Behavioral:  Positive for depression.   ? ?Patient Active Problem List  ? Diagnosis Date Noted  ? Mixed hyperlipidemia 10/06/2017  ? Carpal tunnel syndrome on right 09/20/2016  ? Neuritis of right ulnar nerve 08/22/2016  ? Bilateral wrist pain 07/18/2016  ? Acquired hypothyroidism 04/04/2015  ? OCD (obsessive compulsive disorder) 04/04/2015  ? Atypical facial pain 04/04/2015  ? Primary insomnia 04/04/2015  ? Abnormal WBC count 04/04/2015  ? Macrocytosis 04/04/2015  ? Hx of abnormal cervical Pap smear 04/04/2015  ? ? ?No Known Allergies ? ?Past Surgical History:  ?Procedure Laterality Date  ? CARPAL TUNNEL RELEASE Right 02/2017  ? TONSILLECTOMY    ? ULNAR COLLATERAL LIGAMENT REPAIR Right 09/06/2016  ? Procedure: RIGHT ULNA NERVE TRANSPORTATION;  Surgeon: Leanor Kail, MD;  Location: [place];  Service: Orthopedics;  Laterality: Right;  ? ? ?Social History  ? ?Tobacco Use  ? Smoking status: Never  ? Smokeless tobacco: Never  ?Vaping Use  ? Vaping Use: Never used  ?  Substance Use Topics  ? Alcohol use: Yes  ?  Alcohol/week: 14.0 standard drinks  ?  Types: 7 Glasses of wine, 7 Shots of liquor per week  ?  Comment: daily  ? Drug use: No  ? ? ? ?Medication list has been reviewed and updated. ? ?Current Meds  ?Medication Sig  ? escitalopram (LEXAPRO) 10 MG tablet TAKE 1 TABLET BY MOUTH EVERY DAY  ? Folic Acid-Vit G8-QPY P95 (FOLBEE) 2.5-25-1 MG TABS tablet Take 1 tablet by mouth daily.  ? gabapentin (NEURONTIN) 300 MG capsule Take 1 capsule (300 mg total) by mouth daily as needed.  ? levothyroxine (SYNTHROID) 100 MCG tablet TAKE 1 TABLET BY MOUTH EVERY DAY BEFORE BREAKFAST  ? Multiple  Vitamins-Minerals (MULTIVITAMIN WITH MINERALS) tablet Take 1 tablet by mouth daily.  ? traZODone (DESYREL) 50 MG tablet Take 1 tablet (50 mg total) by mouth at bedtime as needed. for sleep  ? WEGOVY 0.25 MG/0.5ML SOAJ INJECT 0.25MG INTO THE SKIN ONE TIME PER WEEK  ? zolpidem (AMBIEN) 5 MG tablet TAKE 1 TABLET BY MOUTH EVERY DAY AT BEDTIME AS NEEDED FOR SLEEP  ? ? ? ?  08/06/2021  ?  9:07 AM 06/06/2021  ?  8:19 AM 05/04/2021  ?  9:21 AM 04/06/2020  ?  8:53 AM  ?GAD 7 : Generalized Anxiety Score  ?Nervous, Anxious, on Edge 0 0 0 0  ?Control/stop worrying 0 1 0 0  ?Worry too much - different things 0 0 0 0  ?Trouble relaxing 0 0 0 0  ?Restless 0 0 0 0  ?Easily annoyed or irritable 0 0 0 0  ?Afraid - awful might happen 0 0 0 0  ?Total GAD 7 Score 0 1 0 0  ?Anxiety Difficulty Not difficult at all     ? ? ? ?  08/06/2021  ?  9:07 AM  ?Depression screen PHQ 2/9  ?Decreased Interest 0  ?Down, Depressed, Hopeless 0  ?PHQ - 2 Score 0  ?Altered sleeping 0  ?Tired, decreased energy 0  ?Change in appetite 0  ?Feeling bad or failure about yourself  0  ?Trouble concentrating 0  ?Moving slowly or fidgety/restless 0  ?Suicidal thoughts 0  ?PHQ-9 Score 0  ?Difficult doing work/chores Not difficult at all  ? ? ?BP Readings from Last 3 Encounters:  ?08/06/21 128/70  ?06/06/21 110/82  ?05/04/21 122/84  ? ? ?Physical Exam ?Vitals and nursing note reviewed.  ?Constitutional:   ?   General: She is not in acute distress. ?   Appearance: Normal appearance. She is well-developed.  ?HENT:  ?   Head: Normocephalic and atraumatic.  ?Cardiovascular:  ?   Rate and Rhythm: Normal rate and regular rhythm.  ?   Pulses: Normal pulses.  ?Pulmonary:  ?   Effort: Pulmonary effort is normal. No respiratory distress.  ?   Breath sounds: No wheezing or rhonchi.  ?Musculoskeletal:  ?   Cervical back: Normal range of motion.  ?   Right lower leg: No edema.  ?   Left lower leg: No edema.  ?Lymphadenopathy:  ?   Cervical: No cervical adenopathy.  ?Skin: ?   General:  Skin is warm and dry.  ?   Findings: No rash.  ?Neurological:  ?   Mental Status: She is alert and oriented to person, place, and time.  ?Psychiatric:     ?   Mood and Affect: Mood normal.     ?   Behavior: Behavior normal.  ? ? ?Wt Readings  from Last 3 Encounters:  ?08/06/21 187 lb 12.8 oz (85.2 kg)  ?06/06/21 199 lb 12.8 oz (90.6 kg)  ?05/04/21 205 lb (93 kg)  ? ? ?BP 128/70   Pulse 75   Ht _0  (1.626 m)   Wt 187 lb 12.8 oz (85.2 kg)   LMP 07/15/2017 (Exact Date)   SpO2 98%   BMI 32.24 kg/m?  ? ?Assessment and Plan: ?1. BMI 32.0-32.9,adult ?Intolerant of higher dose [employer] and insurance will not cover the 0.25 mg dose ongoing. ?Will switch to [place] which is easier to titrate. ?Follow up in 2 months. ?- Liraglutide -Weight Management (SAXENDA) 18 MG/3ML SOPN; Inject 0.6 mg into the skin daily.  Dispense: 3 mL; Refill: 0 ? ?2. Mixed obsessional thoughts and acts ?Clinically stable on current regimen with good control of symptoms, No SI or HI. ?Will continue current therapy. ?- escitalopram (LEXAPRO) 10 MG tablet; Take 1 tablet (10 mg total) by mouth daily.  Dispense: 90 tablet; Refill: 3 ? ? ?Partially dictated using Editor, commissioning. Any errors are unintentional. ? ?Halina Maidens, MD ?[facility] ?[facility] ? ?08/06/2021 ? ? ? ? ?

## 2021-08-06 NOTE — Telephone Encounter (Signed)
Error.     KP 

## 2021-08-07 NOTE — Telephone Encounter (Signed)
Refilled 08/06/2021 #90 3 refills. ?Requested Prescriptions  ?Pending Prescriptions Disp Refills  ?? escitalopram (LEXAPRO) 10 MG tablet [Pharmacy Med Name: ESCITALOPRAM 10 MG TABLET] 90 tablet 0  ?  Sig: TAKE 1 TABLET BY MOUTH EVERY DAY  ?  ? Psychiatry:  Antidepressants - SSRI Passed - 08/05/2021  9:47 AM  ?  ?  Passed - Valid encounter within last 6 months  ?  Recent Outpatient Visits   ?      ? Yesterday BMI 32.0-32.9,adult  ? [facility] Reubin Milan, MD  ? 2 months ago Obesity (BMI 30.0-34.9)  ? [facility] Reubin Milan, MD  ? 3 months ago Annual physical exam  ? [facility] Reubin Milan, MD  ? 1 year ago Annual physical exam  ? [facility] Reubin Milan, MD  ? 2 years ago Annual physical exam  ? [facility] Reubin Milan, MD  ?  ?  ?Future Appointments   ?        ? In 2 months Judithann Graves Nyoka Cowden, MD [facility], [facility]  ?  ? ?  ?  ?  ? ?

## 2021-10-09 ENCOUNTER — Ambulatory Visit: Payer: BC Managed Care – PPO | Admitting: Internal Medicine

## 2021-10-25 ENCOUNTER — Encounter: Payer: Self-pay | Admitting: Internal Medicine

## 2021-10-26 ENCOUNTER — Ambulatory Visit
Admission: EM | Admit: 2021-10-26 | Discharge: 2021-10-26 | Disposition: A | Discharge status: Home / Self Care | Payer: BC Managed Care – PPO | Attending: Emergency Medicine | Admitting: Emergency Medicine

## 2021-10-26 ENCOUNTER — Ambulatory Visit (INDEPENDENT_AMBULATORY_CARE_PROVIDER_SITE_OTHER): Payer: BC Managed Care – PPO

## 2021-10-26 DIAGNOSIS — Z20822 Contact with and (suspected) exposure to covid-19: Secondary | ICD-10-CM | POA: Insufficient documentation

## 2021-10-26 DIAGNOSIS — J189 Pneumonia, unspecified organism: Secondary | ICD-10-CM | POA: Diagnosis not present

## 2021-10-26 DIAGNOSIS — R509 Fever, unspecified: Secondary | ICD-10-CM | POA: Diagnosis not present

## 2021-10-26 DIAGNOSIS — R059 Cough, unspecified: Secondary | ICD-10-CM

## 2021-10-26 DIAGNOSIS — R0982 Postnasal drip: Secondary | ICD-10-CM | POA: Insufficient documentation

## 2021-10-26 DIAGNOSIS — E039 Hypothyroidism, unspecified: Secondary | ICD-10-CM | POA: Insufficient documentation

## 2021-10-26 DIAGNOSIS — R5381 Other malaise: Secondary | ICD-10-CM | POA: Diagnosis not present

## 2021-10-26 DIAGNOSIS — R0981 Nasal congestion: Secondary | ICD-10-CM | POA: Insufficient documentation

## 2021-10-26 DIAGNOSIS — F429 Obsessive-compulsive disorder, unspecified: Secondary | ICD-10-CM | POA: Diagnosis not present

## 2021-10-26 DIAGNOSIS — R519 Headache, unspecified: Secondary | ICD-10-CM | POA: Diagnosis not present

## 2021-10-26 LAB — RESP PANEL BY RT-PCR (FLU A&B, COVID) ARPGX2
Influenza A by PCR: NEGATIVE
Influenza B by PCR: NEGATIVE
SARS Coronavirus 2 by RT PCR: NEGATIVE

## 2021-10-26 MED ORDER — AEROCHAMBER MV MISC: 1 refills | Status: DC

## 2021-10-26 MED ORDER — PROMETHAZINE-DM 6.25-15 MG/5ML PO SYRP: 5.0000 mL | ORAL_SOLUTION | Freq: Four times a day (QID) | ORAL | 0 refills | Status: DC | PRN

## 2021-10-26 MED ORDER — AMOXICILLIN 500 MG PO TABS: 1000.0000 mg | ORAL_TABLET | Freq: Three times a day (TID) | ORAL | 0 refills | Status: AC

## 2021-10-26 MED ORDER — ALBUTEROL SULFATE HFA 108 (90 BASE) MCG/ACT IN AERS
2.0000 | INHALATION_SPRAY | RESPIRATORY_TRACT | 0 refills | Status: DC | PRN
Start: 2021-10-26 — End: 2021-11-19

## 2021-10-26 NOTE — ED Provider Notes (Signed)
HPI  SUBJECTIVE:  Robin Madden is a 57 y.o. female who presents with the acute onset of fevers Tmax 102.7, chills, malaise, chest heaviness, shortness of breath, headache, nasal congestion, sinus pain pressure, postnasal drip starting last Wednesday.  She was sick for several days, but got completely better earlier this week.  She states that her fever returned 2 days ago, this time measuring 101.3 with decreased appetite, fatigue, cough productive of white phlegm, night sweats, body aches, headaches, wheezing, shortness of breath, dyspnea on exertion.  She reports right sided chest pain present with coughing, certain movements and with bending forward.  No nasal congestion, rhinorrhea, sinus pain and pressure, postnasal drip, sore throat, nausea, vomiting or diarrhea, abdominal pain, urinary complaints.  She took TheraFlu within 6 hours of evaluation without improvement in her symptoms.  She has also tried taking hot baths, Mucinex, vitamin C, D and zinc.  Symptoms are better with resting, worse with being up.  No known COVID or flu exposure.  She got 4 doses of the COVID-vaccine.  She had 3 negative home COVID tests.  She has past medical history of OCD, hypothyroidism.  PCP: [facility].    Past Medical History:  Diagnosis Date   Abnormal WBC count    "has been that way forever, with no explanation" (low)   Anxiety    Atypical facial pain    right side   Complication of anesthesia    History of fracture of fibula 04/04/2015   Hypothyroid    OCD (obsessive compulsive disorder)    PONV (postoperative nausea and vomiting)    after tonsillectomy   Wears contact lenses     Past Surgical History:  Procedure Laterality Date   CARPAL TUNNEL RELEASE Right 02/2017   TONSILLECTOMY     ULNAR COLLATERAL LIGAMENT REPAIR Right 09/06/2016   Procedure: RIGHT ULNA NERVE TRANSPORTATION;  Surgeon: Erin Sons, MD;  Location: [facility];  Service: Orthopedics;  Laterality: Right;     Family History  Problem Relation Age of Onset   Mental illness Mother    Melanoma Brother    CAD Brother    Breast cancer Neg Hx     Social History   Tobacco Use   Smoking status: Never   Smokeless tobacco: Never  Vaping Use   Vaping Use: Never used  Substance Use Topics   Alcohol use: Yes    Alcohol/week: 14.0 standard drinks of alcohol    Types: 7 Glasses of wine, 7 Shots of liquor per week    Comment: daily   Drug use: No    No current facility-administered medications for this encounter.  Current Outpatient Medications:    albuterol (VENTOLIN HFA) 108 (90 Base) MCG/ACT inhaler, Inhale 2 puffs into the lungs every 4 (four) hours as needed for wheezing or shortness of breath. Dispense with aerochamber, Disp: 1 each, Rfl: 0   amoxicillin (AMOXIL) 500 MG tablet, Take 2 tablets (1,000 mg total) by mouth 3 (three) times daily for 5 days., Disp: 30 tablet, Rfl: 0   escitalopram (LEXAPRO) 10 MG tablet, Take 1 tablet (10 mg total) by mouth daily., Disp: 90 tablet, Rfl: 3   Folic Acid-Vit B6-Vit B12 (FOLBEE) 2.5-25-1 MG TABS tablet, Take 1 tablet by mouth daily., Disp: , Rfl:    gabapentin (NEURONTIN) 300 MG capsule, Take 1 capsule (300 mg total) by mouth daily as needed., Disp: 90 capsule, Rfl: 1   levothyroxine (SYNTHROID) 100 MCG tablet, TAKE 1 TABLET BY MOUTH EVERY DAY BEFORE BREAKFAST,  Disp: 90 tablet, Rfl: 1   Liraglutide -Weight Management (SAXENDA) 18 MG/3ML SOPN, Inject 0.6 mg into the skin daily., Disp: 3 mL, Rfl: 0   Multiple Vitamins-Minerals (MULTIVITAMIN WITH MINERALS) tablet, Take 1 tablet by mouth daily., Disp: , Rfl:    promethazine-dextromethorphan (PROMETHAZINE-DM) 6.25-15 MG/5ML syrup, Take 5 mLs by mouth 4 (four) times daily as needed for cough., Disp: 118 mL, Rfl: 0   Spacer/Aero-Holding Chambers (AEROCHAMBER MV) inhaler, Use as instructed, Disp: 1 each, Rfl: 1   traZODone (DESYREL) 50 MG tablet, Take 1 tablet (50 mg total) by mouth at bedtime as needed. for  sleep, Disp: 90 tablet, Rfl: 1   zolpidem (AMBIEN) 5 MG tablet, TAKE 1 TABLET BY MOUTH EVERY DAY AT BEDTIME AS NEEDED FOR SLEEP, Disp: 30 tablet, Rfl: 5  No Known Allergies   ROS  As noted in HPI.   Physical Exam  BP (!) 148/113 (BP Location: Left Arm)   Pulse 74   Temp 98.2 F (36.8 C) (Oral)   Resp 18   Ht 5\' 4"  (1.626 m)   Wt 83.5 kg   LMP 07/15/2017 (Exact Date)   SpO2 99%   BMI 31.58 kg/m   Constitutional: Well developed, well nourished, no acute distress Eyes:  EOMI, conjunctiva normal bilaterally HENT: Normocephalic, atraumatic,mucus membranes moist.  No nasal congestion.  Normal turbinates.  No maxillary, frontal sinus tenderness.  Positive postnasal drip. Neck: No cervical lymphadenopathy Respiratory: Normal inspiratory effort, lungs clear bilaterally, no anterior, lateral chest wall tenderness Cardiovascular: Normal rate, regular rhythm, no murmurs rubs or gallops GI: nondistended skin: No rash, skin intact Musculoskeletal: no deformities Neurologic: Alert & oriented x 3, no focal neuro deficits Psychiatric: Speech and behavior appropriate   ED Course   Medications - No data to display  Orders Placed This Encounter  Procedures   Resp Panel by RT-PCR (Flu A&B, Covid) Anterior Nasal Swab    Standing Status:   Standing    Number of Occurrences:   1   DG Chest 2 View    Standing Status:   Standing    Number of Occurrences:   1    Order Specific Question:   Reason for Exam (SYMPTOM  OR DIAGNOSIS REQUIRED)    Answer:   Cough, fever, rule out pneumonia   Recheck vitals    Please repeat BP after 15 minutes of rest    Standing Status:   Standing    Number of Occurrences:   1   Airborne and Contact precautions    Standing Status:   Standing    Number of Occurrences:   1    Results for orders placed or performed during the hospital encounter of 10/26/21 (from the past 24 hour(s))  Resp Panel by RT-PCR (Flu A&B, Covid) Anterior Nasal Swab     Status: None    Collection Time: 10/26/21 12:31 PM   Specimen: Anterior Nasal Swab  Result Value Ref Range   SARS Coronavirus 2 by RT PCR NEGATIVE NEGATIVE   Influenza A by PCR NEGATIVE NEGATIVE   Influenza B by PCR NEGATIVE NEGATIVE   DG Chest 2 View  Result Date: 10/26/2021 CLINICAL DATA:  Cough and fever.  Evaluate for pneumonia. EXAM: CHEST - 2 VIEW COMPARISON:  Chest and left rib radiographs 09/05/2020 FINDINGS: Cardiac silhouette and mediastinal contours are within normal limits. The lungs are clear. No pleural effusion or pneumothorax. Minimal multilevel degenerative disc changes of the thoracic spine. IMPRESSION: No active cardiopulmonary disease. Electronically Signed   By: 09/07/2020  Viola M.D.   On: 10/26/2021 12:50    ED Clinical Impression  1. Community acquired pneumonia, unspecified laterality      ED Assessment/Plan  Patient with an acute illness with systemic symptoms.  Checking flu/COVID, chest x-ray.    Reviewed imaging independently.  No pneumonia on x-ray.  See radiology report for full details.  COVID, influenza negative  While her x-ray is negative for pneumonia, I am clinically concerned for pneumonia given returned symptoms.  Will send home with amoxicillin 500 mg 3 times daily for 5 days, albuterol inhaler with a spacer, promethazine DM.  May continue vitamin C, D and zinc.  Follow-up with PCP if not getting better in 10 days.  ER return precautions given.  Discussed labs, imaging, MDM, treatment plan, and plan for follow-up with patient. Discussed sn/sx that should prompt return to the ED. patient agrees with plan.   Meds ordered this encounter  Medications   albuterol (VENTOLIN HFA) 108 (90 Base) MCG/ACT inhaler    Sig: Inhale 2 puffs into the lungs every 4 (four) hours as needed for wheezing or shortness of breath. Dispense with aerochamber    Dispense:  1 each    Refill:  0   amoxicillin (AMOXIL) 500 MG tablet    Sig: Take 2 tablets (1,000 mg total) by mouth 3 (three)  times daily for 5 days.    Dispense:  30 tablet    Refill:  0   Spacer/Aero-Holding Chambers (AEROCHAMBER MV) inhaler    Sig: Use as instructed    Dispense:  1 each    Refill:  1   promethazine-dextromethorphan (PROMETHAZINE-DM) 6.25-15 MG/5ML syrup    Sig: Take 5 mLs by mouth 4 (four) times daily as needed for cough.    Dispense:  118 mL    Refill:  0      *This clinic note was created using Scientist, clinical (histocompatibility and immunogenetics). Therefore, there may be occasional mistakes despite careful proofreading.  ?    Domenick Gong, MD 10/26/21 1345

## 2021-10-26 NOTE — Discharge Instructions (Addendum)
I will contact you if your COVID or influenza come back positive, and I will prescribe the appropriate antiviral.  If your COVID and flu are negative, start the amoxicillin.  This will treat a bacterial pneumonia.  2 puffs from your albuterol inhaler using your spacer every 4 hours for 2 days, then every 6 hours for 2 days, then as needed.  You can back off on the albuterol if you start to feel better sooner.  Promethazine DM for cough.  Please follow-up with your doctor in 10 days if you are not better by them.  Give it some time-you will not be 100% when you finish the antibiotics and 5 days, but you should be feeling better.  You can take 600 mg of ibuprofen combined with 1000 mg of Tylenol together 3-4 times a day as needed for body aches and headaches.

## 2021-10-26 NOTE — ED Triage Notes (Signed)
July 26th, pt had a 102.7 fever, body aches, headaches, body chills, Pt took advil for fever and felt better on Monday and Tuesday this week. Pt had another fever of 101.3 on 10/23/2021 and night sweats.   Pt took 3 covid tests and they were negative.  Pt asks for a Flu test and to be checked for walking pneumonia.

## 2021-11-02 ENCOUNTER — Other Ambulatory Visit: Payer: Self-pay | Admitting: Internal Medicine

## 2021-11-02 DIAGNOSIS — E039 Hypothyroidism, unspecified: Secondary | ICD-10-CM

## 2021-11-02 NOTE — Telephone Encounter (Signed)
Requested Prescriptions  Pending Prescriptions Disp Refills  . levothyroxine (SYNTHROID) 100 MCG tablet [Pharmacy Med Name: LEVOTHYROXINE 100 MCG TABLET] 90 tablet 1    Sig: TAKE 1 TABLET BY MOUTH EVERY DAY BEFORE BREAKFAST     Endocrinology:  Hypothyroid Agents Passed - 11/02/2021  2:33 AM      Passed - TSH in normal range and within 360 days    TSH  Date Value Ref Range Status  05/04/2021 1.540 0.450 - 4.500 uIU/mL Final         Passed - Valid encounter within last 12 months    Recent Outpatient Visits          2 months ago BMI 32.0-32.9,adult   [facility] Reubin Milan, MD   4 months ago Obesity (BMI 30.0-34.9)   [facility] Reubin Milan, MD   6 months ago Annual physical exam   [facility] Reubin Milan, MD   1 year ago Annual physical exam   [facility] Reubin Milan, MD   2 years ago Annual physical exam   [facility] Reubin Milan, MD

## 2021-11-19 ENCOUNTER — Encounter: Payer: Self-pay | Admitting: Internal Medicine

## 2021-11-19 ENCOUNTER — Ambulatory Visit: Payer: BC Managed Care – PPO | Admitting: Internal Medicine

## 2021-11-19 VITALS — BP 134/86 | HR 89 | Temp 97.9°F | Ht 64.0 in | Wt 189.0 lb

## 2021-11-19 DIAGNOSIS — J0100 Acute maxillary sinusitis, unspecified: Secondary | ICD-10-CM

## 2021-11-19 MED ORDER — AZITHROMYCIN 250 MG PO TABS: ORAL_TABLET | ORAL | 0 refills | Status: AC

## 2021-11-19 NOTE — Progress Notes (Signed)
Date:  11/19/2021   Name:  Robin Madden   DOB:  1965-06-26   MRN:  [id number]   Chief Complaint: Ear Pain  Otalgia  There is pain in the right ear. This is a new problem. Episode onset: X5 days comes and goes. The problem has been gradually worsening. There has been no fever. The pain is at a severity of 1/10. The pain is mild. Associated symptoms include coughing and a sore throat. Associated symptoms comments: Post nasal drip, nose sore to touch, ears feels warm. She has tried nothing for the symptoms.    Lab Results  Component Value Date   NA 139 05/04/2021   K 4.4 05/04/2021   CO2 23 05/04/2021   GLUCOSE 108 (H) 05/04/2021   BUN 11 05/04/2021   CREATININE 0.77 05/04/2021   CALCIUM 9.4 05/04/2021   EGFR 90 05/04/2021   GFRNONAA 91 04/06/2020   Lab Results  Component Value Date   CHOL 302 (H) 05/04/2021   HDL 51 05/04/2021   LDLCALC 227 (H) 05/04/2021   TRIG 133 05/04/2021   CHOLHDL 5.9 (H) 05/04/2021   Lab Results  Component Value Date   TSH 1.540 05/04/2021   Lab Results  Component Value Date   HGBA1C 5.2 05/04/2021   Lab Results  Component Value Date   WBC 4.1 05/04/2021   HGB 13.8 05/04/2021   HCT 39.5 05/04/2021   MCV 107 (H) 05/04/2021   PLT 178 05/04/2021   Lab Results  Component Value Date   ALT 71 (H) 05/04/2021   AST 66 (H) 05/04/2021   ALKPHOS 79 05/04/2021   BILITOT 0.5 05/04/2021   Lab Results  Component Value Date   VD25OH 33.7 10/03/2017     Review of Systems  Constitutional:  Positive for fatigue. Negative for chills and fever.  HENT:  Positive for ear pain, postnasal drip, sinus pressure and sore throat. Negative for trouble swallowing.   Respiratory:  Positive for cough. Negative for chest tightness and wheezing.   Cardiovascular:  Negative for chest pain, palpitations and leg swelling.    Patient Active Problem List   Diagnosis Date Noted   Mixed hyperlipidemia 10/06/2017   Carpal tunnel syndrome on right 09/20/2016    Neuritis of right ulnar nerve 08/22/2016   Bilateral wrist pain 07/18/2016   Acquired hypothyroidism 04/04/2015   OCD (obsessive compulsive disorder) 04/04/2015   Atypical facial pain 04/04/2015   Primary insomnia 04/04/2015   Abnormal WBC count 04/04/2015   Macrocytosis 04/04/2015   Hx of abnormal cervical Pap smear 04/04/2015    No Known Allergies  Past Surgical History:  Procedure Laterality Date   CARPAL TUNNEL RELEASE Right 02/2017   TONSILLECTOMY     ULNAR COLLATERAL LIGAMENT REPAIR Right 09/06/2016   Procedure: RIGHT ULNA NERVE TRANSPORTATION;  Surgeon: Leanor Kail, MD;  Location: [facility];  Service: Orthopedics;  Laterality: Right;    Social History   Tobacco Use   Smoking status: Never   Smokeless tobacco: Never  Vaping Use   Vaping Use: Never used  Substance Use Topics   Alcohol use: Yes    Alcohol/week: 14.0 standard drinks of alcohol    Types: 7 Glasses of wine, 7 Shots of liquor per week    Comment: daily   Drug use: No     Medication list has been reviewed and updated.  Current Meds  Medication Sig   Ascorbic Acid (VITAMIN C PO) Take by mouth.   escitalopram (LEXAPRO) 10 MG tablet Take  1 tablet (10 mg total) by mouth daily.   Folic Acid-Vit [id number]-[id number] X91 (FOLBEE) 2.5-25-1 MG TABS tablet Take 1 tablet by mouth daily.   gabapentin (NEURONTIN) 300 MG capsule Take 1 capsule (300 mg total) by mouth daily as needed.   levothyroxine (SYNTHROID) 100 MCG tablet TAKE 1 TABLET BY MOUTH EVERY DAY BEFORE BREAKFAST   Multiple Vitamins-Minerals (ZINC PO) Take by mouth.   promethazine-dextromethorphan (PROMETHAZINE-DM) 6.25-15 MG/5ML syrup Take 5 mLs by mouth 4 (four) times daily as needed for cough.   traZODone (DESYREL) 50 MG tablet Take 1 tablet (50 mg total) by mouth at bedtime as needed. for sleep   VITAMIN D PO Take by mouth.   zolpidem (AMBIEN) 5 MG tablet TAKE 1 TABLET BY MOUTH EVERY DAY AT BEDTIME AS NEEDED FOR SLEEP   [DISCONTINUED]  Liraglutide -Weight Management (SAXENDA) 18 MG/3ML SOPN Inject 0.6 mg into the skin daily.   [DISCONTINUED] Multiple Vitamins-Minerals (MULTIVITAMIN WITH MINERALS) tablet Take 1 tablet by mouth daily.   [DISCONTINUED] Spacer/Aero-Holding Chambers (AEROCHAMBER MV) inhaler Use as instructed       11/19/2021    1:38 PM 08/06/2021    9:07 AM 06/06/2021    8:19 AM 05/04/2021    9:21 AM  GAD 7 : Generalized Anxiety Score  Nervous, Anxious, on Edge 0 0 0 0  Control/stop worrying 0 0 1 0  Worry too much - different things 0 0 0 0  Trouble relaxing 0 0 0 0  Restless 0 0 0 0  Easily annoyed or irritable 1 0 0 0  Afraid - awful might happen 0 0 0 0  Total GAD 7 Score 1 0 1 0  Anxiety Difficulty Not difficult at all Not difficult at all         11/19/2021    1:37 PM 08/06/2021    9:07 AM 06/06/2021    8:18 AM  Depression screen PHQ 2/9  Decreased Interest 0 0 0  Down, Depressed, Hopeless 0 0 0  PHQ - 2 Score 0 0 0  Altered sleeping 2 0 1  Tired, decreased energy 2 0 1  Change in appetite 0 0 0  Feeling bad or failure about yourself  0 0 0  Trouble concentrating 0 0 0  Moving slowly or fidgety/restless 0 0 0  Suicidal thoughts 0 0 0  PHQ-9 Score 4 0 2  Difficult doing work/chores Not difficult at all Not difficult at all Not difficult at all    BP Readings from Last 3 Encounters:  11/19/21 134/86  10/26/21 (!) 148/113  08/06/21 128/70    Physical Exam Constitutional:      Appearance: She is well-developed.  HENT:     Right Ear: Ear canal and external ear normal. No middle ear effusion. Tympanic membrane is not erythematous or retracted.     Left Ear: Ear canal and external ear normal.  No middle ear effusion. Tympanic membrane is not erythematous or retracted.     Nose:     Right Sinus: Maxillary sinus tenderness and frontal sinus tenderness present.     Left Sinus: No maxillary sinus tenderness or frontal sinus tenderness.     Mouth/Throat:     Mouth: No oral lesions.      Pharynx: Uvula midline. Posterior oropharyngeal erythema present. No oropharyngeal exudate.  Cardiovascular:     Rate and Rhythm: Normal rate and regular rhythm.     Heart sounds: Normal heart sounds.  Pulmonary:     Breath sounds: Normal breath sounds.  No wheezing, rhonchi or rales.  Lymphadenopathy:     Cervical: No cervical adenopathy.  Neurological:     Mental Status: She is alert and oriented to person, place, and time.     Wt Readings from Last 3 Encounters:  11/19/21 189 lb (85.7 kg)  10/26/21 184 lb (83.5 kg)  08/06/21 187 lb 12.8 oz (85.2 kg)    BP 134/86 (BP Location: Right Arm, Cuff Size: Normal)   Pulse 89   Temp 97.9 F (36.6 C) (Oral)   Ht '5\' 4"'  (1.626 m)   Wt 189 lb (85.7 kg)   LMP 07/15/2017 (Exact Date)   SpO2 98%   BMI 32.44 kg/m   Assessment and Plan: 1. Acute maxillary sinusitis, recurrence not specified Recommend Sudafed 30 mg tid prn congestion Use Flonase NS daily Use Nasal saline PRN for right nares dryness and discomfort - azithromycin (ZITHROMAX Z-PAK) 250 MG tablet; UAD  Dispense: 6 each; Refill: 0   Partially dictated using Editor, commissioning. Any errors are unintentional.  Halina Maidens, MD [facility]  11/19/2021

## 2021-11-19 NOTE — Patient Instructions (Signed)
Saline nasal spray as often as needed  Start Flonase nasal spray daily  Continue to use Sudafed as needed

## 2021-12-05 ENCOUNTER — Other Ambulatory Visit: Payer: Self-pay | Admitting: Internal Medicine

## 2021-12-05 DIAGNOSIS — F5101 Primary insomnia: Secondary | ICD-10-CM

## 2021-12-06 ENCOUNTER — Other Ambulatory Visit: Payer: Self-pay | Admitting: Internal Medicine

## 2021-12-06 NOTE — Telephone Encounter (Signed)
Requested medication (s) are due for refill today:yes  Requested medication (s) are on the active medication list: yes    Last refill: 05/04/21  #30  5 refills  Future visit scheduled Yes 05/22/21  Notes to clinic:Not delegated, please review. Thank you  Requested Prescriptions  Pending Prescriptions Disp Refills   zolpidem (AMBIEN) 5 MG tablet [Pharmacy Med Name: ZOLPIDEM TARTRATE 5 MG TABLET] 30 tablet     Sig: TAKE 1 TABLET BY MOUTH AT BEDTIME AS NEEDED FOR SLEEP     Not Delegated - Psychiatry:  Anxiolytics/Hypnotics Failed - 12/05/2021 10:50 AM      Failed - This refill cannot be delegated      Failed - Urine Drug Screen completed in last 360 days      Passed - Valid encounter within last 6 months    Recent Outpatient Visits           2 weeks ago Acute maxillary sinusitis, recurrence not specified   [facility], Nyoka Cowden, MD   4 months ago BMI 32.0-32.9,adult   [facility] at [facility], Nyoka Cowden, MD   6 months ago Obesity (BMI 30.0-34.9)   [facility], Nyoka Cowden, MD   7 months ago Annual physical exam   [facility] at [facility], Nyoka Cowden, MD   1 year ago Annual physical exam   [facility] at [facility], Nyoka Cowden, MD       Future Appointments             In 5 months Judithann Graves, Nyoka Cowden, MD [facility] at [facility], [facility]

## 2022-03-05 ENCOUNTER — Encounter: Payer: Self-pay | Admitting: Internal Medicine

## 2022-04-01 ENCOUNTER — Encounter: Payer: Self-pay | Admitting: Internal Medicine

## 2022-04-01 ENCOUNTER — Ambulatory Visit (INDEPENDENT_AMBULATORY_CARE_PROVIDER_SITE_OTHER): Payer: BC Managed Care – PPO | Admitting: Internal Medicine

## 2022-04-01 VITALS — BP 148/86 | HR 78 | Ht 64.0 in | Wt 189.0 lb

## 2022-04-01 DIAGNOSIS — J0100 Acute maxillary sinusitis, unspecified: Secondary | ICD-10-CM

## 2022-04-01 MED ORDER — AZITHROMYCIN 250 MG PO TABS: ORAL_TABLET | ORAL | 0 refills | Status: AC

## 2022-04-01 NOTE — Patient Instructions (Signed)
Stop any steroid nasal sprays Use only Nasal Saline  Take oral decongestants if helpful

## 2022-04-01 NOTE — Progress Notes (Signed)
Date:  04/01/2022   Name:  Robin Madden   DOB:  1965/05/31   MRN:  [id number]   Chief Complaint: Sinusitis  Sinusitis This is a new problem. The current episode started 1 to 4 weeks ago. The problem has been waxing and waning since onset. Associated symptoms include congestion, coughing, sinus pressure, a sore throat and swollen glands. Pertinent negatives include no chills or shortness of breath. Past treatments include oral decongestants, saline nose sprays and nasal decongestants. The treatment provided no relief.    Lab Results  Component Value Date   NA 139 05/04/2021   K 4.4 05/04/2021   CO2 23 05/04/2021   GLUCOSE 108 (H) 05/04/2021   BUN 11 05/04/2021   CREATININE 0.77 05/04/2021   CALCIUM 9.4 05/04/2021   EGFR 90 05/04/2021   GFRNONAA 91 04/06/2020   Lab Results  Component Value Date   CHOL 302 (H) 05/04/2021   HDL 51 05/04/2021   LDLCALC 227 (H) 05/04/2021   TRIG 133 05/04/2021   CHOLHDL 5.9 (H) 05/04/2021   Lab Results  Component Value Date   TSH 1.540 05/04/2021   Lab Results  Component Value Date   HGBA1C 5.2 05/04/2021   Lab Results  Component Value Date   WBC 4.1 05/04/2021   HGB 13.8 05/04/2021   HCT 39.5 05/04/2021   MCV 107 (H) 05/04/2021   PLT 178 05/04/2021   Lab Results  Component Value Date   ALT 71 (H) 05/04/2021   AST 66 (H) 05/04/2021   ALKPHOS 79 05/04/2021   BILITOT 0.5 05/04/2021   Lab Results  Component Value Date   VD25OH 33.7 10/03/2017     Review of Systems  Constitutional:  Positive for fatigue. Negative for chills and fever.  HENT:  Positive for congestion, nosebleeds, sinus pressure, sinus pain and sore throat. Negative for trouble swallowing.   Respiratory:  Positive for cough. Negative for chest tightness, shortness of breath and wheezing.   Cardiovascular:  Negative for chest pain and palpitations.  Psychiatric/Behavioral:  Positive for sleep disturbance. Negative for dysphoric mood. The patient is not  nervous/anxious.     Patient Active Problem List   Diagnosis Date Noted   Mixed hyperlipidemia 10/06/2017   Carpal tunnel syndrome on right 09/20/2016   Neuritis of right ulnar nerve 08/22/2016   Bilateral wrist pain 07/18/2016   Acquired hypothyroidism 04/04/2015   OCD (obsessive compulsive disorder) 04/04/2015   Atypical facial pain 04/04/2015   Primary insomnia 04/04/2015   Abnormal WBC count 04/04/2015   Macrocytosis 04/04/2015   Hx of abnormal cervical Pap smear 04/04/2015    No Known Allergies  Past Surgical History:  Procedure Laterality Date   CARPAL TUNNEL RELEASE Right 02/2017   TONSILLECTOMY     ULNAR COLLATERAL LIGAMENT REPAIR Right 09/06/2016   Procedure: RIGHT ULNA NERVE TRANSPORTATION;  Surgeon: Leanor Kail, MD;  Location: [facility];  Service: Orthopedics;  Laterality: Right;    Social History   Tobacco Use   Smoking status: Never   Smokeless tobacco: Never  Vaping Use   Vaping Use: Never used  Substance Use Topics   Alcohol use: Yes    Alcohol/week: 14.0 standard drinks of alcohol    Types: 7 Glasses of wine, 7 Shots of liquor per week    Comment: daily   Drug use: No     Medication list has been reviewed and updated.  Current Meds  Medication Sig   Ascorbic Acid (VITAMIN C PO) Take by mouth.  azithromycin (ZITHROMAX Z-PAK) 250 MG tablet UAD   escitalopram (LEXAPRO) 10 MG tablet Take 1 tablet (10 mg total) by mouth daily.   Folic Acid-Vit B6-Vit B12 (FOLBEE) 2.5-25-1 MG TABS tablet Take 1 tablet by mouth daily.   gabapentin (NEURONTIN) 300 MG capsule Take 1 capsule (300 mg total) by mouth daily as needed.   levothyroxine (SYNTHROID) 100 MCG tablet TAKE 1 TABLET BY MOUTH EVERY DAY BEFORE BREAKFAST   Multiple Vitamins-Minerals (ZINC PO) Take by mouth.   traZODone (DESYREL) 50 MG tablet Take 1 tablet (50 mg total) by mouth at bedtime as needed. for sleep   VITAMIN D PO Take by mouth.   zolpidem (AMBIEN) 5 MG tablet TAKE 1 TABLET BY  MOUTH AT BEDTIME AS NEEDED FOR SLEEP       04/01/2022   11:14 AM 11/19/2021    1:38 PM 08/06/2021    9:07 AM 06/06/2021    8:19 AM  GAD 7 : Generalized Anxiety Score  Nervous, Anxious, on Edge 1 0 0 0  Control/stop worrying 2 0 0 1  Worry too much - different things 2 0 0 0  Trouble relaxing 1 0 0 0  Restless 0 0 0 0  Easily annoyed or irritable 0 1 0 0  Afraid - awful might happen 0 0 0 0  Total GAD 7 Score 6 1 0 1  Anxiety Difficulty Not difficult at all Not difficult at all Not difficult at all        04/01/2022   11:13 AM 11/19/2021    1:37 PM 08/06/2021    9:07 AM  Depression screen PHQ 2/9  Decreased Interest 0 0 0  Down, Depressed, Hopeless 0 0 0  PHQ - 2 Score 0 0 0  Altered sleeping 0 2 0  Tired, decreased energy 0 2 0  Change in appetite 0 0 0  Feeling bad or failure about yourself  0 0 0  Trouble concentrating 0 0 0  Moving slowly or fidgety/restless 0 0 0  Suicidal thoughts 0 0 0  PHQ-9 Score 0 4 0  Difficult doing work/chores Not difficult at all Not difficult at all Not difficult at all    BP Readings from Last 3 Encounters:  04/01/22 (!) 148/86  11/19/21 134/86  10/26/21 (!) 148/113    Physical Exam Constitutional:      Appearance: She is well-developed.  HENT:     Right Ear: Ear canal and external ear normal. Tympanic membrane is not erythematous or retracted.     Left Ear: Ear canal and external ear normal. Tympanic membrane is not erythematous or retracted.     Nose:     Right Sinus: Maxillary sinus tenderness present. No frontal sinus tenderness.     Left Sinus: Maxillary sinus tenderness present. No frontal sinus tenderness.     Mouth/Throat:     Mouth: No oral lesions.     Pharynx: Uvula midline. Posterior oropharyngeal erythema present. No oropharyngeal exudate.  Cardiovascular:     Rate and Rhythm: Normal rate and regular rhythm.     Heart sounds: Normal heart sounds.  Pulmonary:     Breath sounds: Normal breath sounds. No wheezing or  rales.  Musculoskeletal:     Cervical back: Normal range of motion. Tenderness present.  Lymphadenopathy:     Cervical: Cervical adenopathy (on right side) present.  Neurological:     Mental Status: She is alert and oriented to person, place, and time.     Wt Readings from Last  3 Encounters:  04/01/22 189 lb (85.7 kg)  11/19/21 189 lb (85.7 kg)  10/26/21 184 lb (83.5 kg)    BP (!) 148/86   Pulse 78   Ht 5\' 4"  (1.626 m)   Wt 189 lb (85.7 kg)   LMP 07/15/2017 (Exact Date)   SpO2 99%   BMI 32.44 kg/m   Assessment and Plan: Problem List Items Addressed This Visit   None Visit Diagnoses     Acute non-recurrent maxillary sinusitis    -  Primary   Stop Flonase due to bleeding Use nasal saline and oral decongestants   Relevant Medications   azithromycin (ZITHROMAX Z-PAK) 250 MG tablet        Partially dictated using Editor, commissioning. Any errors are unintentional.  Halina Maidens, MD [facility]  04/01/2022

## 2022-05-02 ENCOUNTER — Other Ambulatory Visit: Payer: Self-pay | Admitting: Internal Medicine

## 2022-05-02 DIAGNOSIS — E039 Hypothyroidism, unspecified: Secondary | ICD-10-CM

## 2022-05-22 ENCOUNTER — Encounter: Payer: BC Managed Care – PPO | Admitting: Internal Medicine

## 2022-06-10 ENCOUNTER — Encounter: Payer: Self-pay | Admitting: Internal Medicine

## 2022-06-10 NOTE — Telephone Encounter (Signed)
Please review.  KP

## 2022-07-28 ENCOUNTER — Other Ambulatory Visit: Payer: Self-pay | Admitting: Internal Medicine

## 2022-07-28 DIAGNOSIS — F422 Mixed obsessional thoughts and acts: Secondary | ICD-10-CM

## 2022-07-29 NOTE — Telephone Encounter (Signed)
Requested Prescriptions  Pending Prescriptions Disp Refills   escitalopram (LEXAPRO) 10 MG tablet [Pharmacy Med Name: ESCITALOPRAM 10 MG TABLET] 90 tablet 0    Sig: TAKE 1 TABLET BY MOUTH EVERY DAY     Psychiatry:  Antidepressants - SSRI Passed - 07/28/2022  9:46 AM      Passed - Valid encounter within last 6 months    Recent Outpatient Visits           3 months ago Acute non-recurrent maxillary sinusitis   [place] [facility] at [facility], Nyoka Cowden, MD   8 months ago Acute maxillary sinusitis, recurrence not specified   [facility] at [facility], Nyoka Cowden, MD   11 months ago BMI 32.0-32.9,adult   [facility] at [facility], Nyoka Cowden, MD   1 year ago Obesity (BMI 30.0-34.9)   [place] [facility] at [facility], Nyoka Cowden, MD   1 year ago Annual physical exam   [facility] at [facility], Nyoka Cowden, MD       Future Appointments             In 5 months Judithann Graves, Nyoka Cowden, MD [facility] Medicine at [facility], [facility]

## 2022-10-11 ENCOUNTER — Other Ambulatory Visit: Payer: Self-pay | Admitting: Internal Medicine

## 2022-10-11 DIAGNOSIS — Z1231 Encounter for screening mammogram for malignant neoplasm of breast: Secondary | ICD-10-CM

## 2022-10-30 ENCOUNTER — Other Ambulatory Visit: Payer: Self-pay | Admitting: Internal Medicine

## 2022-10-30 DIAGNOSIS — F422 Mixed obsessional thoughts and acts: Secondary | ICD-10-CM

## 2022-11-12 ENCOUNTER — Other Ambulatory Visit: Payer: Self-pay | Admitting: Internal Medicine

## 2022-11-12 DIAGNOSIS — E039 Hypothyroidism, unspecified: Secondary | ICD-10-CM

## 2022-11-12 DIAGNOSIS — F5101 Primary insomnia: Secondary | ICD-10-CM

## 2022-11-13 NOTE — Telephone Encounter (Signed)
Requested medication (s) are due for refill today: Yes  Requested medication (s) are on the active medication list: Yes  Last refill:  12/06/21  Future visit scheduled: Yes  Notes to clinic:  Not delegated.    Requested Prescriptions  Pending Prescriptions Disp Refills   zolpidem (AMBIEN) 5 MG tablet [Pharmacy Med Name: ZOLPIDEM TARTRATE 5 MG TABLET] 30 tablet     Sig: TAKE 1 TABLET BY MOUTH EVERY DAY AT BEDTIME AS NEEDED FOR SLEEP     Not Delegated - Psychiatry:  Anxiolytics/Hypnotics Failed - 11/12/2022  3:02 PM      Failed - This refill cannot be delegated      Failed - Urine Drug Screen completed in last 360 days      Failed - Valid encounter within last 6 months    Recent Outpatient Visits           7 months ago Acute non-recurrent maxillary sinusitis   [place] [facility] at [facility], Nyoka Cowden, MD   11 months ago Acute maxillary sinusitis, recurrence not specified   [place] [facility] at [facility], Nyoka Cowden, MD   1 year ago BMI 32.0-32.9,adult   [facility] at [facility], Nyoka Cowden, MD   1 year ago Obesity (BMI 30.0-34.9)   [place] [facility] at [facility], Nyoka Cowden, MD   1 year ago Annual physical exam   [facility] at [facility], Nyoka Cowden, MD       Future Appointments             In 1 month Judithann Graves Nyoka Cowden, MD [facility] Primary [facility] Medicine at [facility], [facility]            Signed Prescriptions Disp Refills   levothyroxine (SYNTHROID) 100 MCG tablet 90 tablet 1    Sig: TAKE 1 TABLET BY MOUTH EVERY DAY BEFORE BREAKFAST     Endocrinology:  Hypothyroid Agents Failed - 11/12/2022  3:02 PM      Failed - TSH in normal range and within 360 days    TSH  Date Value Ref Range Status  05/04/2021 1.540 0.450 - 4.500 uIU/mL Final         Passed -  Valid encounter within last 12 months    Recent Outpatient Visits           7 months ago Acute non-recurrent maxillary sinusitis   [place] [facility] at [facility], Nyoka Cowden, MD   11 months ago Acute maxillary sinusitis, recurrence not specified   [place] [facility] at [facility], Nyoka Cowden, MD   1 year ago BMI 32.0-32.9,adult   [facility] at [facility], Nyoka Cowden, MD   1 year ago Obesity (BMI 30.0-34.9)   [place] [facility] at [facility], Nyoka Cowden, MD   1 year ago Annual physical exam   [facility] at [facility], Nyoka Cowden, MD       Future Appointments             In 1 month Judithann Graves, Nyoka Cowden, MD [facility] Primary Care & Sports Medicine at [employer], [facility]

## 2022-11-13 NOTE — Telephone Encounter (Signed)
Please review.  KP

## 2022-11-13 NOTE — Telephone Encounter (Signed)
Requested Prescriptions  Pending Prescriptions Disp Refills   levothyroxine (SYNTHROID) 100 MCG tablet [Pharmacy Med Name: LEVOTHYROXINE 100 MCG TABLET] 90 tablet 1    Sig: TAKE 1 TABLET BY MOUTH EVERY DAY BEFORE BREAKFAST     Endocrinology:  Hypothyroid Agents Failed - 11/12/2022  3:02 PM      Failed - TSH in normal range and within 360 days    TSH  Date Value Ref Range Status  05/04/2021 1.540 0.450 - 4.500 uIU/mL Final         Passed - Valid encounter within last 12 months    Recent Outpatient Visits           7 months ago Acute non-recurrent maxillary sinusitis   [place] [facility] at [facility], Nyoka Cowden, MD   11 months ago Acute maxillary sinusitis, recurrence not specified   [place] [facility] at [facility], Nyoka Cowden, MD   1 year ago BMI 32.0-32.9,adult   [facility] at [facility], Nyoka Cowden, MD   1 year ago Obesity (BMI 30.0-34.9)   [place] [facility] at [facility], Nyoka Cowden, MD   1 year ago Annual physical exam   [facility] at [facility], Nyoka Cowden, MD       Future Appointments             In 1 month Judithann Graves, Nyoka Cowden, MD [facility] Primary [facility] Sports Medicine at [facility], [facility]             zolpidem (AMBIEN) 5 MG tablet [Pharmacy Med Name: ZOLPIDEM TARTRATE 5 MG TABLET] 30 tablet     Sig: TAKE 1 TABLET BY MOUTH EVERY DAY AT BEDTIME AS NEEDED FOR SLEEP     Not Delegated - Psychiatry:  Anxiolytics/Hypnotics Failed - 11/12/2022  3:02 PM      Failed - This refill cannot be delegated      Failed - Urine Drug Screen completed in last 360 days      Failed - Valid encounter within last 6 months    Recent Outpatient Visits           7 months ago Acute non-recurrent maxillary sinusitis   [place] [facility] at [facility], Nyoka Cowden, MD   11 months ago Acute maxillary sinusitis, recurrence not specified   [place] [facility] at [facility], Nyoka Cowden, MD   1 year ago BMI 32.0-32.9,adult   [facility] at [facility], Nyoka Cowden, MD   1 year ago Obesity (BMI 30.0-34.9)   [place] [facility] at [facility], Nyoka Cowden, MD   1 year ago Annual physical exam   [facility] at [facility], Nyoka Cowden, MD       Future Appointments             In 1 month Judithann Graves, Nyoka Cowden, MD [facility] at [facility], [facility]

## 2023-01-02 ENCOUNTER — Ambulatory Visit
Admission: RE | Admit: 2023-01-02 | Discharge: 2023-01-02 | Disposition: A | Discharge status: Home / Self Care | Payer: Managed Care, Other (non HMO) | Source: Ambulatory Visit | Attending: Internal Medicine | Admitting: Internal Medicine

## 2023-01-02 ENCOUNTER — Encounter: Payer: Self-pay | Admitting: Internal Medicine

## 2023-01-02 ENCOUNTER — Ambulatory Visit: Payer: Managed Care, Other (non HMO) | Admitting: Internal Medicine

## 2023-01-02 VITALS — BP 132/78 | HR 90 | Ht 64.0 in | Wt 176.8 lb

## 2023-01-02 DIAGNOSIS — Z1231 Encounter for screening mammogram for malignant neoplasm of breast: Secondary | ICD-10-CM | POA: Diagnosis present

## 2023-01-02 DIAGNOSIS — G2581 Restless legs syndrome: Secondary | ICD-10-CM

## 2023-01-02 DIAGNOSIS — Z0000 Encounter for general adult medical examination without abnormal findings: Secondary | ICD-10-CM

## 2023-01-02 DIAGNOSIS — E039 Hypothyroidism, unspecified: Secondary | ICD-10-CM

## 2023-01-02 DIAGNOSIS — F422 Mixed obsessional thoughts and acts: Secondary | ICD-10-CM

## 2023-01-02 DIAGNOSIS — F5101 Primary insomnia: Secondary | ICD-10-CM

## 2023-01-02 DIAGNOSIS — Z23 Encounter for immunization: Secondary | ICD-10-CM | POA: Diagnosis not present

## 2023-01-02 DIAGNOSIS — E782 Mixed hyperlipidemia: Secondary | ICD-10-CM

## 2023-01-02 MED ORDER — ZOLPIDEM TARTRATE 5 MG PO TABS: ORAL_TABLET | ORAL | 3 refills | Status: DC

## 2023-01-02 MED ORDER — ESCITALOPRAM OXALATE 20 MG PO TABS: 20.0000 mg | ORAL_TABLET | Freq: Every day | ORAL | 1 refills | Status: DC

## 2023-01-02 MED ORDER — ROPINIROLE HCL 0.25 MG PO TABS: 0.2500 mg | ORAL_TABLET | Freq: Every day | ORAL | 0 refills | Status: DC

## 2023-01-02 NOTE — Assessment & Plan Note (Signed)
Supplemented,  Lab Results  Component Value Date   TSH 1.540 05/04/2021

## 2023-01-02 NOTE — Assessment & Plan Note (Addendum)
Currently being treated with Lexapro 10 mg.   More depression and irritability recently. Working from home but staying isolated. Will increase to 15 mg for 1-2 weeks then 20 mg.

## 2023-01-02 NOTE — Patient Instructions (Signed)
Take Requip 2-3 hours before bedtime nightly to prevent restless legs.

## 2023-01-02 NOTE — Assessment & Plan Note (Signed)
New onset recently.  Will start Requip 0.25 mg every evening. Check CBC and iron levels.

## 2023-01-02 NOTE — Progress Notes (Signed)
Date:  01/02/2023   Name:  Robin Madden   DOB:  07-22-1965   MRN:  [id number]   Chief Complaint: Annual Exam Robin M Gerety is a 57 y.o. female who presents today for her Complete Annual Exam. She feels fairly well. She reports exercising - some. She reports she is sleeping poorly. Breast complaints - none.  She is losing weight steadily with calorie restriction.  So far she has lost 24 lbs.  Mammogram: scheduled today DEXA: none Colonoscopy: Cologuard 04/2021 neg Pap: 03/2020 neg/neg  Health Maintenance Due  Topic Date Due   HIV Screening  Never done   MAMMOGRAM  06/29/2022    Immunization History  Administered Date(s) Administered   Influenza, Seasonal, Injecte, Preservative Fre 01/02/2023   Influenza,inj,Quad PF,6+ Mos 04/04/2015, 01/12/2016, 01/06/2019, 02/02/2020, 01/10/2021, 12/19/2021   Influenza-Unspecified 01/06/2019   Moderna Covid-19 Vaccine Bivalent Booster 59yrs & up 01/10/2021   Moderna Sars-Covid-2 Vaccination 06/07/2019, 07/08/2019, 02/02/2020, 10/18/2020   Pfizer(Comirnaty)Fall Seasonal Vaccine 12 years and older 01/02/2023   Tdap 09/20/2015   Unspecified SARS-COV-2 Vaccination 12/25/2021   Zoster Recombinant(Shingrix) 10/03/2018, 01/06/2019     Thyroid Problem Presents for follow-up visit. Patient reports no anxiety, cold intolerance, depressed mood, diarrhea, fatigue, hoarse voice, leg swelling, palpitations or visual change. The symptoms have been stable.  Insomnia Primary symptoms: sleep disturbance, difficulty falling asleep, frequent awakening.   The problem occurs nightly. The problem is unchanged. Treatments tried: Ambien and Trazodone.  RLS -  new onset of typical symptoms, has to get up and walk or take a hot bath.  Occurring most nights and worsening.  Review of Systems  Constitutional:  Negative for chills and fatigue.  HENT:  Negative for hoarse voice and trouble swallowing.   Respiratory:  Negative for chest tightness and shortness  of breath.   Cardiovascular:  Negative for palpitations.  Gastrointestinal:  Negative for abdominal pain and diarrhea.  Endocrine: Negative for cold intolerance.  Genitourinary:  Negative for frequency and urgency.  Musculoskeletal:  Positive for myalgias. Negative for arthralgias.  Neurological:  Negative for dizziness, light-headedness and headaches.  Psychiatric/Behavioral:  Positive for dysphoric mood and sleep disturbance. The patient has insomnia. The patient is not nervous/anxious.      Lab Results  Component Value Date   NA 139 05/04/2021   K 4.4 05/04/2021   CO2 23 05/04/2021   GLUCOSE 108 (H) 05/04/2021   BUN 11 05/04/2021   CREATININE 0.77 05/04/2021   CALCIUM 9.4 05/04/2021   EGFR 90 05/04/2021   GFRNONAA 91 04/06/2020   Lab Results  Component Value Date   CHOL 302 (H) 05/04/2021   HDL 51 05/04/2021   LDLCALC 227 (H) 05/04/2021   TRIG 133 05/04/2021   CHOLHDL 5.9 (H) 05/04/2021   Lab Results  Component Value Date   TSH 1.540 05/04/2021   Lab Results  Component Value Date   HGBA1C 5.2 05/04/2021   Lab Results  Component Value Date   WBC 4.1 05/04/2021   HGB 13.8 05/04/2021   HCT 39.5 05/04/2021   MCV 107 (H) 05/04/2021   PLT 178 05/04/2021   Lab Results  Component Value Date   ALT 71 (H) 05/04/2021   AST 66 (H) 05/04/2021   ALKPHOS 79 05/04/2021   BILITOT 0.5 05/04/2021   Lab Results  Component Value Date   VD25OH 33.7 10/03/2017     Patient Active Problem List   Diagnosis Date Noted   Restless legs syndrome (RLS) 01/02/2023   Mixed hyperlipidemia 10/06/2017  Carpal tunnel syndrome on right 09/20/2016   Neuritis of right ulnar nerve 08/22/2016   Bilateral wrist pain 07/18/2016   Acquired hypothyroidism 04/04/2015   OCD (obsessive compulsive disorder) 04/04/2015   Atypical facial pain 04/04/2015   Primary insomnia 04/04/2015   Macrocytosis 04/04/2015   Hx of abnormal cervical Pap smear 04/04/2015    No Known Allergies  Past  Surgical History:  Procedure Laterality Date   CARPAL TUNNEL RELEASE Right 02/2017   TONSILLECTOMY     ULNAR COLLATERAL LIGAMENT REPAIR Right 09/06/2016   Procedure: RIGHT ULNA NERVE TRANSPORTATION;  Surgeon: Erin Sons, MD;  Location: [facility];  Service: Orthopedics;  Laterality: Right;    Social History   Tobacco Use   Smoking status: Never   Smokeless tobacco: Never  Vaping Use   Vaping status: Never Used  Substance Use Topics   Alcohol use: Yes    Alcohol/week: 14.0 standard drinks of alcohol    Types: 7 Glasses of wine, 7 Shots of liquor per week    Comment: daily   Drug use: No     Medication list has been reviewed and updated.  Current Meds  Medication Sig   Ascorbic Acid (VITAMIN C PO) Take by mouth.   Folic Acid-Vit B6-Vit B12 (FOLBEE) 2.5-25-1 MG TABS tablet Take 1 tablet by mouth daily.   gabapentin (NEURONTIN) 300 MG capsule Take 1 capsule (300 mg total) by mouth daily as needed.   levothyroxine (SYNTHROID) 100 MCG tablet TAKE 1 TABLET BY MOUTH EVERY DAY BEFORE BREAKFAST   Multiple Vitamins-Minerals (ZINC PO) Take by mouth.   rOPINIRole (REQUIP) 0.25 MG tablet Take 1 tablet (0.25 mg total) by mouth at bedtime.   VITAMIN D PO Take by mouth.   [DISCONTINUED] escitalopram (LEXAPRO) 10 MG tablet TAKE 1 TABLET BY MOUTH EVERY DAY   [DISCONTINUED] traZODone (DESYREL) 50 MG tablet Take 1 tablet (50 mg total) by mouth at bedtime as needed. for sleep   [DISCONTINUED] zolpidem (AMBIEN) 5 MG tablet TAKE 1 TABLET BY MOUTH EVERY DAY AT BEDTIME AS NEEDED FOR SLEEP       01/02/2023    8:26 AM 04/01/2022   11:14 AM 11/19/2021    1:38 PM 08/06/2021    9:07 AM  GAD 7 : Generalized Anxiety Score  Nervous, Anxious, on Edge 1 1 0 0  Control/stop worrying 1 2 0 0  Worry too much - different things 1 2 0 0  Trouble relaxing 0 1 0 0  Restless 0 0 0 0  Easily annoyed or irritable 2 0 1 0  Afraid - awful might happen 0 0 0 0  Total GAD 7 Score 5 6 1  0  Anxiety  Difficulty Somewhat difficult Not difficult at all Not difficult at all Not difficult at all       01/02/2023    8:26 AM 04/01/2022   11:13 AM 11/19/2021    1:37 PM  Depression screen PHQ 2/9  Decreased Interest 0 0 0  Down, Depressed, Hopeless 1 0 0  PHQ - 2 Score 1 0 0  Altered sleeping 3 0 2  Tired, decreased energy 3 0 2  Change in appetite 0 0 0  Feeling bad or failure about yourself  0 0 0  Trouble concentrating 1 0 0  Moving slowly or fidgety/restless 0 0 0  Suicidal thoughts 0 0 0  PHQ-9 Score 8 0 4  Difficult doing work/chores Somewhat difficult Not difficult at all Not difficult at all    BP  Readings from Last 3 Encounters:  01/02/23 132/78  04/01/22 (!) 148/86  11/19/21 134/86    Physical Exam Vitals and nursing note reviewed.  Constitutional:      General: She is not in acute distress.    Appearance: She is well-developed.  HENT:     Head: Normocephalic and atraumatic.     Right Ear: Tympanic membrane and ear canal normal.     Left Ear: Tympanic membrane and ear canal normal.     Nose:     Right Sinus: No maxillary sinus tenderness.     Left Sinus: No maxillary sinus tenderness.  Eyes:     General: No scleral icterus.       Right eye: No discharge.        Left eye: No discharge.     Conjunctiva/sclera: Conjunctivae normal.  Neck:     Thyroid: No thyromegaly.     Vascular: No carotid bruit.  Cardiovascular:     Rate and Rhythm: Normal rate and regular rhythm.     Pulses: Normal pulses.     Heart sounds: Normal heart sounds.  Pulmonary:     Effort: Pulmonary effort is normal. No respiratory distress.     Breath sounds: No wheezing.  Chest:  Breasts:    Right: No mass, nipple discharge, skin change or tenderness.     Left: No mass, nipple discharge, skin change or tenderness.  Abdominal:     General: Bowel sounds are normal.     Palpations: Abdomen is soft.     Tenderness: There is no abdominal tenderness.  Musculoskeletal:     Cervical back:  Normal range of motion. No erythema.     Right lower leg: No edema.     Left lower leg: No edema.  Lymphadenopathy:     Cervical: No cervical adenopathy.  Skin:    General: Skin is warm and dry.     Findings: No rash.  Neurological:     Mental Status: She is alert and oriented to person, place, and time.     Cranial Nerves: No cranial nerve deficit.     Sensory: No sensory deficit.     Deep Tendon Reflexes: Reflexes are normal and symmetric.  Psychiatric:        Attention and Perception: Attention normal.        Mood and Affect: Mood normal.     Wt Readings from Last 3 Encounters:  01/02/23 176 lb 12.8 oz (80.2 kg)  04/01/22 189 lb (85.7 kg)  11/19/21 189 lb (85.7 kg)    BP 132/78   Pulse 90   Ht 5\' 4"  (1.626 m)   Wt 176 lb 12.8 oz (80.2 kg)   LMP 07/15/2017 (Exact Date)   SpO2 95%   BMI 30.35 kg/m   Assessment and Plan:  Problem List Items Addressed This Visit       Unprioritized   Acquired hypothyroidism (Chronic)    Supplemented,  Lab Results  Component Value Date   TSH 1.540 05/04/2021         Relevant Orders   TSH + free T4   Mixed hyperlipidemia (Chronic)    Lipids managed with diet changes Lab Results  Component Value Date   LDLCALC 227 (H) 05/04/2021         Relevant Orders   Lipid panel   OCD (obsessive compulsive disorder) (Chronic)    Currently being treated with Lexapro 10 mg.   More depression and irritability recently. Working from home but staying isolated.  Will increase to 15 mg for 1-2 weeks then 20 mg.      Relevant Medications   escitalopram (LEXAPRO) 20 MG tablet   Primary insomnia    Taking Ambien as needed for sleep.      Relevant Medications   zolpidem (AMBIEN) 5 MG tablet   Restless legs syndrome (RLS)    New onset recently.  Will start Requip 0.25 mg every evening. Check CBC and iron levels.      Relevant Medications   rOPINIRole (REQUIP) 0.25 MG tablet   Other Relevant Orders   CBC with  Differential/Platelet   Iron, TIBC and Ferritin Panel   Other Visit Diagnoses     Annual physical exam    -  Primary   Relevant Orders   CBC with Differential/Platelet   Comprehensive metabolic panel   Lipid panel   TSH + free T4   Encounter for screening mammogram for breast cancer       Need for influenza vaccination       Relevant Orders   Flu vaccine trivalent PF, 6mos and older(Flulaval,Afluria,Fluarix,Fluzone) (Completed)   Need for COVID-19 vaccine       Relevant Orders   Pfizer Comirnaty Covid -19 Vaccine 63yrs and older (Completed)       Return in about 2 months (around 03/04/2023) for RLS, OCD.    Reubin Milan, MD [facility] [place]

## 2023-01-02 NOTE — Assessment & Plan Note (Addendum)
Taking Ambien as needed for sleep.

## 2023-01-02 NOTE — Assessment & Plan Note (Signed)
Lipids managed with diet changes Lab Results  Component Value Date   LDLCALC 227 (H) 05/04/2021

## 2023-01-03 ENCOUNTER — Encounter: Payer: Self-pay | Admitting: Internal Medicine

## 2023-01-03 ENCOUNTER — Other Ambulatory Visit: Payer: Self-pay | Admitting: Internal Medicine

## 2023-01-03 DIAGNOSIS — R7989 Other specified abnormal findings of blood chemistry: Secondary | ICD-10-CM

## 2023-01-03 LAB — LIPID PANEL
Chol/HDL Ratio: 7.6 {ratio} — ABNORMAL HIGH (ref 0.0–4.4)
Cholesterol, Total: 287 mg/dL — ABNORMAL HIGH (ref 100–199)
HDL: 38 mg/dL — ABNORMAL LOW (ref >39)
LDL Chol Calc (NIH): 214 mg/dL — ABNORMAL HIGH (ref 0–99)
Triglycerides: 182 mg/dL — ABNORMAL HIGH (ref 0–149)
VLDL Cholesterol Cal: 35 mg/dL (ref 5–40)

## 2023-01-03 LAB — COMPREHENSIVE METABOLIC PANEL
ALT: 48 [IU]/L — ABNORMAL HIGH (ref 0–32)
AST: 78 [IU]/L — ABNORMAL HIGH (ref 0–40)
Albumin: 4.7 g/dL (ref 3.8–4.9)
Alkaline Phosphatase: 145 [IU]/L — ABNORMAL HIGH (ref 44–121)
BUN/Creatinine Ratio: 9 (ref 9–23)
BUN: 6 mg/dL (ref 6–24)
Bilirubin Total: 1 mg/dL (ref 0.0–1.2)
CO2: 21 mmol/L (ref 20–29)
Calcium: 9.6 mg/dL (ref 8.7–10.2)
Chloride: 102 mmol/L (ref 96–106)
Creatinine, Ser: 0.7 mg/dL (ref 0.57–1.00)
Globulin, Total: 2.9 g/dL (ref 1.5–4.5)
Glucose: 103 mg/dL — ABNORMAL HIGH (ref 70–99)
Potassium: 4 mmol/L (ref 3.5–5.2)
Sodium: 140 mmol/L (ref 134–144)
Total Protein: 7.6 g/dL (ref 6.0–8.5)
eGFR: 101 mL/min/{1.73_m2} (ref >59)

## 2023-01-03 LAB — CBC WITH DIFFERENTIAL/PLATELET
Basophils Absolute: 0 10*3/uL (ref 0.0–0.2)
Basos: 0 %
EOS (ABSOLUTE): 0.1 10*3/uL (ref 0.0–0.4)
Eos: 2 %
Hematocrit: 44.3 % (ref 34.0–46.6)
Hemoglobin: 14.7 g/dL (ref 11.1–15.9)
Immature Grans (Abs): 0 10*3/uL (ref 0.0–0.1)
Immature Granulocytes: 0 %
Lymphocytes Absolute: 1.7 10*3/uL (ref 0.7–3.1)
Lymphs: 25 %
MCH: 36.8 pg — ABNORMAL HIGH (ref 26.6–33.0)
MCHC: 33.2 g/dL (ref 31.5–35.7)
MCV: 111 fL — ABNORMAL HIGH (ref 79–97)
Monocytes Absolute: 0.4 10*3/uL (ref 0.1–0.9)
Monocytes: 6 %
Neutrophils Absolute: 4.5 10*3/uL (ref 1.4–7.0)
Neutrophils: 67 %
Platelets: 139 10*3/uL — ABNORMAL LOW (ref 150–450)
RBC: 3.99 x10E6/uL (ref 3.77–5.28)
RDW: 12.4 % (ref 11.7–15.4)
WBC: 6.7 10*3/uL (ref 3.4–10.8)

## 2023-01-03 LAB — IRON,TIBC AND FERRITIN PANEL
Ferritin: 99 ng/mL (ref 15–150)
Iron Saturation: 24 % (ref 15–55)
Iron: 100 ug/dL (ref 27–159)
Total Iron Binding Capacity: 416 ug/dL (ref 250–450)
UIBC: 316 ug/dL (ref 131–425)

## 2023-01-03 LAB — TSH+FREE T4
Free T4: 1.22 ng/dL (ref 0.82–1.77)
TSH: 2.53 u[IU]/mL (ref 0.450–4.500)

## 2023-01-07 ENCOUNTER — Encounter: Payer: Self-pay | Admitting: Internal Medicine

## 2023-01-07 ENCOUNTER — Ambulatory Visit
Admission: RE | Admit: 2023-01-07 | Discharge: 2023-01-07 | Disposition: A | Discharge status: Home / Self Care | Payer: Managed Care, Other (non HMO) | Source: Ambulatory Visit | Attending: Internal Medicine | Admitting: Internal Medicine

## 2023-01-07 DIAGNOSIS — R7989 Other specified abnormal findings of blood chemistry: Secondary | ICD-10-CM | POA: Diagnosis present

## 2023-01-07 DIAGNOSIS — K760 Fatty (change of) liver, not elsewhere classified: Secondary | ICD-10-CM | POA: Insufficient documentation

## 2023-03-02 ENCOUNTER — Encounter: Payer: Self-pay | Admitting: Internal Medicine

## 2023-03-03 ENCOUNTER — Other Ambulatory Visit: Payer: Self-pay | Admitting: Internal Medicine

## 2023-03-03 DIAGNOSIS — J0100 Acute maxillary sinusitis, unspecified: Secondary | ICD-10-CM

## 2023-03-11 ENCOUNTER — Ambulatory Visit: Payer: Managed Care, Other (non HMO) | Admitting: Internal Medicine

## 2023-03-25 ENCOUNTER — Ambulatory Visit: Payer: Managed Care, Other (non HMO) | Admitting: Internal Medicine

## 2023-03-28 ENCOUNTER — Other Ambulatory Visit: Payer: Self-pay | Admitting: Internal Medicine

## 2023-03-28 DIAGNOSIS — G2581 Restless legs syndrome: Secondary | ICD-10-CM

## 2023-03-31 NOTE — Telephone Encounter (Signed)
 Requested Prescriptions  Pending Prescriptions Disp Refills   rOPINIRole  (REQUIP ) 0.25 MG tablet [Pharmacy Med Name: ROPINIROLE  HCL 0.25 MG TABLET] 90 tablet 2    Sig: TAKE 1 TABLET BY MOUTH AT BEDTIME.     Neurology:  Parkinsonian Agents Passed - 03/31/2023  5:48 PM      Passed - Last BP in normal range    BP Readings from Last 1 Encounters:  01/02/23 132/78         Passed - Last Heart Rate in normal range    Pulse Readings from Last 1 Encounters:  01/02/23 90         Passed - Valid encounter within last 12 months    Recent Outpatient Visits           2 months ago Annual physical exam   [place] [facility] at [facility], Leita DEL, MD   12 months ago Acute non-recurrent maxillary sinusitis   [place] [facility] at [facility], Leita DEL, MD   1 year ago Acute maxillary sinusitis, recurrence not specified   [place] [facility] at [facility], Leita DEL, MD   1 year ago BMI 32.0-32.9,adult   [facility] at [facility], Leita DEL, MD   1 year ago Obesity (BMI 30.0-34.9)   [place] [facility] at [facility], Leita DEL, MD       Future Appointments             In 3 days Justus Leita DEL, MD [facility] at [facility], [place]   In 1 month Justus, Leita DEL, MD [facility] at [facility], [facility]   In 9 months Justus, Leita DEL, MD [facility] at [facility], [facility]

## 2023-04-01 ENCOUNTER — Encounter: Payer: Self-pay | Admitting: Internal Medicine

## 2023-04-01 ENCOUNTER — Other Ambulatory Visit: Payer: Self-pay | Admitting: Internal Medicine

## 2023-04-01 DIAGNOSIS — G501 Atypical facial pain: Secondary | ICD-10-CM

## 2023-04-01 MED ORDER — GABAPENTIN 300 MG PO CAPS: 300.0000 mg | ORAL_CAPSULE | Freq: Every day | ORAL | 1 refills | Status: DC | PRN

## 2023-04-03 ENCOUNTER — Encounter: Payer: Self-pay | Admitting: Internal Medicine

## 2023-04-03 ENCOUNTER — Ambulatory Visit: Payer: Managed Care, Other (non HMO) | Admitting: Internal Medicine

## 2023-05-18 ENCOUNTER — Encounter: Payer: Self-pay | Admitting: Internal Medicine

## 2023-05-19 ENCOUNTER — Ambulatory Visit: Payer: Self-pay | Admitting: Internal Medicine

## 2023-05-20 ENCOUNTER — Other Ambulatory Visit (HOSPITAL_COMMUNITY)
Admission: RE | Admit: 2023-05-20 | Discharge: 2023-05-20 | Disposition: A | Discharge status: Home / Self Care | Payer: Managed Care, Other (non HMO) | Source: Ambulatory Visit | Attending: Internal Medicine | Admitting: Internal Medicine

## 2023-05-20 ENCOUNTER — Ambulatory Visit: Payer: Managed Care, Other (non HMO) | Admitting: Internal Medicine

## 2023-05-20 ENCOUNTER — Encounter: Payer: Self-pay | Admitting: Internal Medicine

## 2023-05-20 ENCOUNTER — Other Ambulatory Visit: Payer: Self-pay | Admitting: Internal Medicine

## 2023-05-20 VITALS — BP 126/74 | HR 104 | Temp 97.7°F | Ht 64.0 in | Wt 173.0 lb

## 2023-05-20 DIAGNOSIS — R350 Frequency of micturition: Secondary | ICD-10-CM

## 2023-05-20 DIAGNOSIS — N760 Acute vaginitis: Secondary | ICD-10-CM | POA: Insufficient documentation

## 2023-05-20 LAB — POCT URINALYSIS DIPSTICK
Bilirubin, UA: NEGATIVE
Glucose, UA: NEGATIVE
Ketones, UA: NEGATIVE
Nitrite, UA: NEGATIVE
Protein, UA: NEGATIVE
Spec Grav, UA: 1.02 (ref 1.010–1.025)
Urobilinogen, UA: 0.2 U/dL
pH, UA: 5 (ref 5.0–8.0)

## 2023-05-20 MED ORDER — METRONIDAZOLE 500 MG PO TABS: 500.0000 mg | ORAL_TABLET | Freq: Two times a day (BID) | ORAL | 0 refills | Status: DC

## 2023-05-20 MED ORDER — FLUCONAZOLE 100 MG PO TABS: 100.0000 mg | ORAL_TABLET | ORAL | 0 refills | Status: AC

## 2023-05-20 NOTE — Progress Notes (Signed)
 Date:  05/20/2023   Name:  Robin Madden   DOB:  Mar 25, 1966   MRN:  [id number]   Chief Complaint: Vaginitis (X1-2 weeks. Cramping pains in left lower pelvic area. Felt pain in vagina, so tried monistat. No better. Now having clear, jelly-like discharge. Patient is in a lot of pain. Has not wore pants in a week, and hurts to sit down.)  Vaginal Discharge The patient's primary symptoms include pelvic pain and vaginal discharge. The current episode started in the past 7 days. The problem occurs constantly. The problem has been gradually worsening. The pain is moderate. The problem affects both sides. Pertinent negatives include no chills or headaches.    Review of Systems  Constitutional:  Negative for chills and fatigue.  Respiratory:  Negative for chest tightness and shortness of breath.   Cardiovascular:  Negative for chest pain.  Genitourinary:  Positive for pelvic pain and vaginal discharge.  Neurological:  Negative for headaches.  Psychiatric/Behavioral:  Negative for dysphoric mood and sleep disturbance. The patient is not nervous/anxious.      Lab Results  Component Value Date   NA 140 01/02/2023   K 4.0 01/02/2023   CO2 21 01/02/2023   GLUCOSE 103 (H) 01/02/2023   BUN 6 01/02/2023   CREATININE 0.70 01/02/2023   CALCIUM 9.6 01/02/2023   EGFR 101 01/02/2023   GFRNONAA 91 04/06/2020   Lab Results  Component Value Date   CHOL 287 (H) 01/02/2023   HDL 38 (L) 01/02/2023   LDLCALC 214 (H) 01/02/2023   TRIG 182 (H) 01/02/2023   CHOLHDL 7.6 (H) 01/02/2023   Lab Results  Component Value Date   TSH 2.530 01/02/2023   Lab Results  Component Value Date   HGBA1C 5.2 05/04/2021   Lab Results  Component Value Date   WBC 6.7 01/02/2023   HGB 14.7 01/02/2023   HCT 44.3 01/02/2023   MCV 111 (H) 01/02/2023   PLT 139 (L) 01/02/2023   Lab Results  Component Value Date   ALT 48 (H) 01/02/2023   AST 78 (H) 01/02/2023   ALKPHOS 145 (H) 01/02/2023   BILITOT 1.0  01/02/2023   Lab Results  Component Value Date   VD25OH 33.7 10/03/2017     Patient Active Problem List   Diagnosis Date Noted   Elevated liver function tests 01/07/2023   Restless legs syndrome (RLS) 01/02/2023   Mixed hyperlipidemia 10/06/2017   Carpal tunnel syndrome on right 09/20/2016   Neuritis of right ulnar nerve 08/22/2016   Bilateral wrist pain 07/18/2016   Acquired hypothyroidism 04/04/2015   OCD (obsessive compulsive disorder) 04/04/2015   Atypical facial pain 04/04/2015   Primary insomnia 04/04/2015   Macrocytosis 04/04/2015   Hx of abnormal cervical Pap smear 04/04/2015    No Known Allergies  Past Surgical History:  Procedure Laterality Date   CARPAL TUNNEL RELEASE Right 02/2017   TONSILLECTOMY     ULNAR COLLATERAL LIGAMENT REPAIR Right 09/06/2016   Procedure: RIGHT ULNA NERVE TRANSPORTATION;  Surgeon: Erin Sons, MD;  Location: [facility];  Service: Orthopedics;  Laterality: Right;    Social History   Tobacco Use   Smoking status: Never   Smokeless tobacco: Never  Vaping Use   Vaping status: Never Used  Substance Use Topics   Alcohol use: Yes    Alcohol/week: 14.0 standard drinks of alcohol    Types: 7 Glasses of wine, 7 Shots of liquor per week    Comment: daily   Drug use: No  Medication list has been reviewed and updated.  Current Meds  Medication Sig   Ascorbic Acid (VITAMIN C PO) Take by mouth.   escitalopram (LEXAPRO) 20 MG tablet Take 1 tablet (20 mg total) by mouth daily.   fluconazole (DIFLUCAN) 100 MG tablet Take 1 tablet (100 mg total) by mouth every other day for 6 days.   Folic Acid-Vit B6-Vit B12 (FOLBEE) 2.5-25-1 MG TABS tablet Take 1 tablet by mouth daily.   gabapentin (NEURONTIN) 300 MG capsule Take 1 capsule (300 mg total) by mouth daily as needed.   levothyroxine (SYNTHROID) 100 MCG tablet TAKE 1 TABLET BY MOUTH EVERY DAY BEFORE BREAKFAST   metroNIDAZOLE (FLAGYL) 500 MG tablet Take 1 tablet (500 mg total) by  mouth 2 (two) times daily for 7 days.   Multiple Vitamins-Minerals (ZINC PO) Take by mouth.   rOPINIRole (REQUIP) 0.25 MG tablet TAKE 1 TABLET BY MOUTH AT BEDTIME.   VITAMIN D PO Take by mouth.   zolpidem (AMBIEN) 5 MG tablet TAKE 1 TABLET BY MOUTH EVERY DAY AT BEDTIME AS NEEDED FOR SLEEP       01/02/2023    8:26 AM 04/01/2022   11:14 AM 11/19/2021    1:38 PM 08/06/2021    9:07 AM  GAD 7 : Generalized Anxiety Score  Nervous, Anxious, on Edge 1 1 0 0  Control/stop worrying 1 2 0 0  Worry too much - different things 1 2 0 0  Trouble relaxing 0 1 0 0  Restless 0 0 0 0  Easily annoyed or irritable 2 0 1 0  Afraid - awful might happen 0 0 0 0  Total GAD 7 Score 5 6 1  0  Anxiety Difficulty Somewhat difficult Not difficult at all Not difficult at all Not difficult at all       01/02/2023    8:26 AM 04/01/2022   11:13 AM 11/19/2021    1:37 PM  Depression screen PHQ 2/9  Decreased Interest 0 0 0  Down, Depressed, Hopeless 1 0 0  PHQ - 2 Score 1 0 0  Altered sleeping 3 0 2  Tired, decreased energy 3 0 2  Change in appetite 0 0 0  Feeling bad or failure about yourself  0 0 0  Trouble concentrating 1 0 0  Moving slowly or fidgety/restless 0 0 0  Suicidal thoughts 0 0 0  PHQ-9 Score 8 0 4  Difficult doing work/chores Somewhat difficult Not difficult at all Not difficult at all    BP Readings from Last 3 Encounters:  05/20/23 126/74  01/02/23 132/78  04/01/22 (!) 148/86    Physical Exam Vitals and nursing note reviewed.  Constitutional:      General: She is not in acute distress.    Appearance: Normal appearance. She is well-developed.  HENT:     Head: Normocephalic and atraumatic.  Cardiovascular:     Rate and Rhythm: Normal rate and regular rhythm.  Pulmonary:     Effort: Pulmonary effort is normal. No respiratory distress.     Breath sounds: No wheezing or rhonchi.  Genitourinary:    Labia:        Right: Tenderness present. No rash, lesion or injury.        Left:  Tenderness present. No rash, lesion or injury.      Urethra: No prolapse.     Vagina: Vaginal discharge present.     Cervix: Normal.     Adnexa: Right adnexa normal and left adnexa normal.  Skin:  General: Skin is warm and dry.     Findings: No rash.  Neurological:     Mental Status: She is alert and oriented to person, place, and time.  Psychiatric:        Mood and Affect: Mood normal.        Behavior: Behavior normal.     Wt Readings from Last 3 Encounters:  05/20/23 173 lb (78.5 kg)  01/02/23 176 lb 12.8 oz (80.2 kg)  04/01/22 189 lb (85.7 kg)    BP 126/74   Pulse (!) 104   Temp 97.7 F (36.5 C) (Oral)   Ht 5\' 4"  (1.626 m)   Wt 173 lb (78.5 kg)   LMP 07/15/2017 (Exact Date)   SpO2 97%   BMI 29.70 kg/m   Assessment and Plan:  Problem List Items Addressed This Visit   None Visit Diagnoses       Acute vaginitis    -  Primary   use diaper rash ointment as needed or Coconut oil prn   Relevant Medications   metroNIDAZOLE (FLAGYL) 500 MG tablet   fluconazole (DIFLUCAN) 100 MG tablet   Other Relevant Orders   Cervicovaginal ancillary only     Urinary frequency       Relevant Orders   POCT urinalysis dipstick   Urine Culture       No follow-ups on file.    Reubin Milan, MD [facility] [place]

## 2023-05-20 NOTE — Patient Instructions (Signed)
 Use Desitin or other diaper rash ointment to external genitalia as needed for comfort.  Can also use coconut oil - pat on externally as needed.

## 2023-05-21 ENCOUNTER — Encounter: Payer: Self-pay | Admitting: Internal Medicine

## 2023-05-21 LAB — CERVICOVAGINAL ANCILLARY ONLY
Bacterial Vaginitis (gardnerella): NEGATIVE
Candida Glabrata: NEGATIVE
Candida Vaginitis: NEGATIVE
Chlamydia: NEGATIVE
Comment: NEGATIVE
Comment: NEGATIVE
Comment: NEGATIVE
Comment: NEGATIVE
Comment: NEGATIVE
Comment: NORMAL
Neisseria Gonorrhea: NEGATIVE
Trichomonas: POSITIVE — AB

## 2023-05-22 ENCOUNTER — Encounter: Payer: Self-pay | Admitting: Internal Medicine

## 2023-05-22 LAB — URINE CULTURE: Organism ID, Bacteria: NO GROWTH

## 2023-05-23 ENCOUNTER — Other Ambulatory Visit: Payer: Self-pay | Admitting: Internal Medicine

## 2023-05-23 DIAGNOSIS — E039 Hypothyroidism, unspecified: Secondary | ICD-10-CM

## 2023-05-26 NOTE — Telephone Encounter (Signed)
 Requested Prescriptions  Pending Prescriptions Disp Refills   levothyroxine (SYNTHROID) 100 MCG tablet [Pharmacy Med Name: LEVOTHYROXINE 100 MCG TABLET] 90 tablet 2    Sig: TAKE 1 TABLET BY MOUTH EVERY DAY BEFORE BREAKFAST     Endocrinology:  Hypothyroid Agents Passed - 05/26/2023  7:50 AM      Passed - TSH in normal range and within 360 days    TSH  Date Value Ref Range Status  01/02/2023 2.530 0.450 - 4.500 uIU/mL Final         Passed - Valid encounter within last 12 months    Recent Outpatient Visits           4 months ago Annual physical exam   [place] [facility] at [facility], Nyoka Cowden, MD   1 year ago Acute non-recurrent maxillary sinusitis   [place] [facility] at [facility], Nyoka Cowden, MD   1 year ago Acute maxillary sinusitis, recurrence not specified   [place] [facility] at [facility], Nyoka Cowden, MD   1 year ago BMI 32.0-32.9,adult   [facility] at [facility], Nyoka Cowden, MD   1 year ago Obesity (BMI 30.0-34.9)   [place] [facility] at [facility], Nyoka Cowden, MD       Future Appointments             In 2 weeks Judithann Graves Nyoka Cowden, MD [facility] Medicine at [facility], [facility]   In 7 months Judithann Graves, Nyoka Cowden, MD [facility] at [facility]

## 2023-05-28 ENCOUNTER — Encounter: Payer: Self-pay | Admitting: Internal Medicine

## 2023-05-29 ENCOUNTER — Other Ambulatory Visit: Payer: Self-pay | Admitting: Internal Medicine

## 2023-05-29 DIAGNOSIS — G501 Atypical facial pain: Secondary | ICD-10-CM

## 2023-05-29 MED ORDER — GABAPENTIN 300 MG PO CAPS: 300.0000 mg | ORAL_CAPSULE | Freq: Three times a day (TID) | ORAL | 1 refills | Status: DC

## 2023-05-29 NOTE — Telephone Encounter (Signed)
 Please review.  KP

## 2023-06-09 ENCOUNTER — Ambulatory Visit: Payer: Managed Care, Other (non HMO) | Admitting: Internal Medicine

## 2023-06-09 ENCOUNTER — Encounter: Payer: Self-pay | Admitting: Internal Medicine

## 2023-06-09 VITALS — BP 122/78 | HR 72 | Ht 64.0 in | Wt 173.2 lb

## 2023-06-09 DIAGNOSIS — E782 Mixed hyperlipidemia: Secondary | ICD-10-CM | POA: Diagnosis not present

## 2023-06-09 DIAGNOSIS — K760 Fatty (change of) liver, not elsewhere classified: Secondary | ICD-10-CM

## 2023-06-09 NOTE — Assessment & Plan Note (Addendum)
 Working with low fat diet to improve hepatic health. She does continue to consume alcohol daily.  Long discussion regarding cutting back and difficulties related to that. US showed mild steatosis but FIB-4 elevated. Will repeat labs.

## 2023-06-09 NOTE — Progress Notes (Signed)
 Date:  06/09/2023   Name:  Robin Madden   DOB:  04/13/1965   MRN:  [id number]   Chief Complaint: Results (Recheck labs)  Hyperlipidemia This is a chronic problem. Recent lipid tests were reviewed and are high. Pertinent negatives include no chest pain, myalgias or shortness of breath. Current antihyperlipidemic treatment includes diet change.  She is working on low fat diet and exercise and has reached a weight plateau.  She continues to drink 2-3 mixed drinks nightly. She did not tolerate Wegovy at low dose due to persistent nausea and vomiting.  Review of Systems  Constitutional:  Negative for chills, fatigue and unexpected weight change.  HENT:  Negative for trouble swallowing.   Eyes:  Negative for visual disturbance.  Respiratory:  Negative for cough, chest tightness, shortness of breath and wheezing.   Cardiovascular:  Negative for chest pain, palpitations and leg swelling.  Gastrointestinal:  Negative for abdominal pain, constipation and diarrhea.  Genitourinary:  Negative for dysuria.  Musculoskeletal:  Negative for arthralgias and myalgias.  Neurological:  Negative for dizziness, weakness, light-headedness and headaches.  Psychiatric/Behavioral:  Positive for sleep disturbance (controlled with Ambien and gabapentin). Negative for dysphoric mood. The patient is not nervous/anxious.      Lab Results  Component Value Date   NA 140 01/02/2023   K 4.0 01/02/2023   CO2 21 01/02/2023   GLUCOSE 103 (H) 01/02/2023   BUN 6 01/02/2023   CREATININE 0.70 01/02/2023   CALCIUM 9.6 01/02/2023   EGFR 101 01/02/2023   GFRNONAA 91 04/06/2020   Lab Results  Component Value Date   CHOL 287 (H) 01/02/2023   HDL 38 (L) 01/02/2023   LDLCALC 214 (H) 01/02/2023   TRIG 182 (H) 01/02/2023   CHOLHDL 7.6 (H) 01/02/2023   Lab Results  Component Value Date   TSH 2.530 01/02/2023   Lab Results  Component Value Date   HGBA1C 5.2 05/04/2021   Lab Results  Component Value Date    WBC 6.7 01/02/2023   HGB 14.7 01/02/2023   HCT 44.3 01/02/2023   MCV 111 (H) 01/02/2023   PLT 139 (L) 01/02/2023   Lab Results  Component Value Date   ALT 48 (H) 01/02/2023   AST 78 (H) 01/02/2023   ALKPHOS 145 (H) 01/02/2023   BILITOT 1.0 01/02/2023   Lab Results  Component Value Date   VD25OH 33.7 10/03/2017     Patient Active Problem List   Diagnosis Date Noted   Hepatic steatosis 01/07/2023   Restless legs syndrome (RLS) 01/02/2023   Mixed hyperlipidemia 10/06/2017   Carpal tunnel syndrome on right 09/20/2016   Neuritis of right ulnar nerve 08/22/2016   Bilateral wrist pain 07/18/2016   Acquired hypothyroidism 04/04/2015   OCD (obsessive compulsive disorder) 04/04/2015   Atypical facial pain 04/04/2015   Primary insomnia 04/04/2015   Macrocytosis 04/04/2015   Hx of abnormal cervical Pap smear 04/04/2015    No Known Allergies  Past Surgical History:  Procedure Laterality Date   CARPAL TUNNEL RELEASE Right 02/2017   TONSILLECTOMY     ULNAR COLLATERAL LIGAMENT REPAIR Right 09/06/2016   Procedure: RIGHT ULNA NERVE TRANSPORTATION;  Surgeon: Erin Sons, MD;  Location: [facility];  Service: Orthopedics;  Laterality: Right;    Social History   Tobacco Use   Smoking status: Never   Smokeless tobacco: Never  Vaping Use   Vaping status: Never Used  Substance Use Topics   Alcohol use: Yes    Alcohol/week: 14.0 standard drinks  of alcohol    Types: 7 Glasses of wine, 7 Shots of liquor per week    Comment: daily   Drug use: No     Medication list has been reviewed and updated.  Current Meds  Medication Sig   Ascorbic Acid (VITAMIN C PO) Take by mouth.   escitalopram (LEXAPRO) 20 MG tablet Take 1 tablet (20 mg total) by mouth daily.   Folic Acid-Vit B6-Vit B12 (FOLBEE) 2.5-25-1 MG TABS tablet Take 1 tablet by mouth daily.   gabapentin (NEURONTIN) 300 MG capsule Take 1 capsule (300 mg total) by mouth 3 (three) times daily.   levothyroxine  (SYNTHROID) 100 MCG tablet TAKE 1 TABLET BY MOUTH EVERY DAY BEFORE BREAKFAST   Multiple Vitamins-Minerals (ZINC PO) Take by mouth.   VITAMIN D PO Take by mouth.   zolpidem (AMBIEN) 5 MG tablet TAKE 1 TABLET BY MOUTH EVERY DAY AT BEDTIME AS NEEDED FOR SLEEP       06/09/2023    8:37 AM 01/02/2023    8:26 AM 04/01/2022   11:14 AM 11/19/2021    1:38 PM  GAD 7 : Generalized Anxiety Score  Nervous, Anxious, on Edge 0 1 1 0  Control/stop worrying 1 1 2  0  Worry too much - different things 0 1 2 0  Trouble relaxing 0 0 1 0  Restless 0 0 0 0  Easily annoyed or irritable 0 2 0 1  Afraid - awful might happen 0 0 0 0  Total GAD 7 Score 1 5 6 1   Anxiety Difficulty Not difficult at all Somewhat difficult Not difficult at all Not difficult at all       06/09/2023    8:36 AM 01/02/2023    8:26 AM 04/01/2022   11:13 AM  Depression screen PHQ 2/9  Decreased Interest 0 0 0  Down, Depressed, Hopeless 0 1 0  PHQ - 2 Score 0 1 0  Altered sleeping 0 3 0  Tired, decreased energy 1 3 0  Change in appetite 0 0 0  Feeling bad or failure about yourself  0 0 0  Trouble concentrating 0 1 0  Moving slowly or fidgety/restless 0 0 0  Suicidal thoughts 0 0 0  PHQ-9 Score 1 8 0  Difficult doing work/chores Not difficult at all Somewhat difficult Not difficult at all    BP Readings from Last 3 Encounters:  06/09/23 122/78  05/20/23 126/74  01/02/23 132/78    Physical Exam Vitals and nursing note reviewed.  Constitutional:      General: She is not in acute distress.    Appearance: Normal appearance. She is well-developed.  HENT:     Head: Normocephalic and atraumatic.  Cardiovascular:     Rate and Rhythm: Normal rate and regular rhythm.     Heart sounds: No murmur heard. Pulmonary:     Effort: Pulmonary effort is normal. No respiratory distress.     Breath sounds: No wheezing or rhonchi.  Abdominal:     General: Abdomen is flat.     Palpations: Abdomen is soft.     Tenderness: There is no  abdominal tenderness. There is no guarding or rebound.  Musculoskeletal:        General: Normal range of motion.     Cervical back: Normal range of motion.     Right lower leg: No edema.     Left lower leg: No edema.  Skin:    General: Skin is warm and dry.     Capillary  Refill: Capillary refill takes less than 2 seconds.     Findings: No rash.  Neurological:     General: No focal deficit present.     Mental Status: She is alert and oriented to person, place, and time.  Psychiatric:        Mood and Affect: Mood normal.        Behavior: Behavior normal.     Wt Readings from Last 3 Encounters:  06/09/23 173 lb 4 oz (78.6 kg)  05/20/23 173 lb (78.5 kg)  01/02/23 176 lb 12.8 oz (80.2 kg)    BP 122/78   Pulse 72   Ht 5\' 4"  (1.626 m)   Wt 173 lb 4 oz (78.6 kg)   LMP 07/15/2017 (Exact Date)   SpO2 95%   BMI 29.74 kg/m   Assessment and Plan:  Problem List Items Addressed This Visit       Unprioritized   Mixed hyperlipidemia (Chronic)   Cholesterol managed with diet only.  Continue efforts for continued weight loss. Will repeat labs and consider statin therapy.      Relevant Orders   Lipid panel   Hepatic steatosis - Primary (Chronic)   Working with low fat diet to improve hepatic health. She does continue to consume alcohol daily.  Long discussion regarding cutting back and difficulties related to that. US showed mild steatosis but FIB-4 elevated. Will repeat labs.      Relevant Orders   Comprehensive metabolic panel   CBC with Differential/Platelet    No follow-ups on file.    Reubin Milan, MD [facility] Primary Care and Sports Medicine [place]

## 2023-06-09 NOTE — Assessment & Plan Note (Addendum)
 Cholesterol managed with diet only.  Continue efforts for continued weight loss. Will repeat labs and consider statin therapy.

## 2023-06-10 ENCOUNTER — Encounter: Payer: Self-pay | Admitting: Internal Medicine

## 2023-06-10 LAB — COMPREHENSIVE METABOLIC PANEL
ALT: 48 IU/L — ABNORMAL HIGH (ref 0–32)
AST: 79 IU/L — ABNORMAL HIGH (ref 0–40)
Albumin: 4.6 g/dL (ref 3.8–4.9)
Alkaline Phosphatase: 101 IU/L (ref 44–121)
BUN/Creatinine Ratio: 13 (ref 9–23)
BUN: 9 mg/dL (ref 6–24)
Bilirubin Total: 1 mg/dL (ref 0.0–1.2)
CO2: 25 mmol/L (ref 20–29)
Calcium: 10.4 mg/dL — ABNORMAL HIGH (ref 8.7–10.2)
Chloride: 98 mmol/L (ref 96–106)
Creatinine, Ser: 0.7 mg/dL (ref 0.57–1.00)
Globulin, Total: 2.8 g/dL (ref 1.5–4.5)
Glucose: 104 mg/dL — ABNORMAL HIGH (ref 70–99)
Potassium: 3.8 mmol/L (ref 3.5–5.2)
Sodium: 140 mmol/L (ref 134–144)
Total Protein: 7.4 g/dL (ref 6.0–8.5)
eGFR: 100 mL/min/{1.73_m2} (ref >59)

## 2023-06-10 LAB — CBC WITH DIFFERENTIAL/PLATELET
Basophils Absolute: 0 10*3/uL (ref 0.0–0.2)
Basos: 1 %
EOS (ABSOLUTE): 0.1 10*3/uL (ref 0.0–0.4)
Eos: 1 %
Hematocrit: 38.6 % (ref 34.0–46.6)
Hemoglobin: 13.5 g/dL (ref 11.1–15.9)
Immature Grans (Abs): 0 10*3/uL (ref 0.0–0.1)
Immature Granulocytes: 0 %
Lymphocytes Absolute: 1.6 10*3/uL (ref 0.7–3.1)
Lymphs: 40 %
MCH: 38 pg — ABNORMAL HIGH (ref 26.6–33.0)
MCHC: 35 g/dL (ref 31.5–35.7)
MCV: 109 fL — ABNORMAL HIGH (ref 79–97)
Monocytes Absolute: 0.4 10*3/uL (ref 0.1–0.9)
Monocytes: 9 %
Neutrophils Absolute: 1.9 10*3/uL (ref 1.4–7.0)
Neutrophils: 49 %
Platelets: 128 10*3/uL — ABNORMAL LOW (ref 150–450)
RBC: 3.55 x10E6/uL — ABNORMAL LOW (ref 3.77–5.28)
RDW: 12.4 % (ref 11.7–15.4)
WBC: 3.9 10*3/uL (ref 3.4–10.8)

## 2023-06-10 LAB — LIPID PANEL
Chol/HDL Ratio: 4.7 ratio — ABNORMAL HIGH (ref 0.0–4.4)
Cholesterol, Total: 326 mg/dL — ABNORMAL HIGH (ref 100–199)
HDL: 69 mg/dL (ref >39)
LDL Chol Calc (NIH): 231 mg/dL — ABNORMAL HIGH (ref 0–99)
Triglycerides: 142 mg/dL (ref 0–149)
VLDL Cholesterol Cal: 26 mg/dL (ref 5–40)

## 2023-06-10 NOTE — Telephone Encounter (Signed)
 Pt response.  KP

## 2023-06-16 ENCOUNTER — Encounter: Payer: Self-pay | Admitting: Internal Medicine

## 2023-06-17 ENCOUNTER — Other Ambulatory Visit: Payer: Self-pay | Admitting: Internal Medicine

## 2023-06-17 DIAGNOSIS — E663 Overweight: Secondary | ICD-10-CM | POA: Insufficient documentation

## 2023-06-17 DIAGNOSIS — K760 Fatty (change of) liver, not elsewhere classified: Secondary | ICD-10-CM

## 2023-06-17 MED ORDER — TIRZEPATIDE-WEIGHT MANAGEMENT 2.5 MG/0.5ML ~~LOC~~ SOLN: 2.5000 mg | SUBCUTANEOUS | 0 refills | Status: DC

## 2023-06-17 NOTE — Telephone Encounter (Signed)
 Please review and advise.   JM

## 2023-06-25 ENCOUNTER — Telehealth: Payer: Self-pay

## 2023-06-25 NOTE — Telephone Encounter (Signed)
 Denied  Message from [employer]: Drug is not covered by plan   PA completed waiting on insurance information.  Key: Robin Madden  [id number]

## 2023-06-25 NOTE — Telephone Encounter (Signed)
Pt is aware.  KP

## 2023-07-02 ENCOUNTER — Other Ambulatory Visit: Payer: Self-pay | Admitting: Internal Medicine

## 2023-07-02 DIAGNOSIS — F422 Mixed obsessional thoughts and acts: Secondary | ICD-10-CM

## 2023-07-02 NOTE — Telephone Encounter (Signed)
 Requested Prescriptions  Pending Prescriptions Disp Refills   escitalopram (LEXAPRO) 20 MG tablet [Pharmacy Med Name: ESCITALOPRAM 20 MG TABLET] 90 tablet 1    Sig: TAKE 1 TABLET BY MOUTH EVERY DAY     Psychiatry:  Antidepressants - SSRI Passed - 07/02/2023  3:44 PM      Passed - Valid encounter within last 6 months    Recent Outpatient Visits           3 weeks ago Hepatic steatosis   [place] [facility] at [facility], Nyoka Cowden, MD   1 month ago Acute vaginitis   [facility] Medicine at [facility], Nyoka Cowden, MD       Future Appointments             In 5 months Judithann Graves, Nyoka Cowden, MD [facility] Medicine at [facility], [facility]

## 2023-07-03 ENCOUNTER — Other Ambulatory Visit: Payer: Self-pay | Admitting: Internal Medicine

## 2023-07-03 DIAGNOSIS — F5101 Primary insomnia: Secondary | ICD-10-CM

## 2023-07-04 NOTE — Telephone Encounter (Signed)
 Please review.  KP

## 2023-07-04 NOTE — Telephone Encounter (Signed)
 Requested medication (s) are due for refill today -yes  Requested medication (s) are on the active medication list -yes  Future visit scheduled -yes  Last refill: 01/02/23 #30 3RF  Notes to clinic: non delegated Rx  Requested Prescriptions  Pending Prescriptions Disp Refills   zolpidem (AMBIEN) 5 MG tablet [Pharmacy Med Name: ZOLPIDEM TARTRATE 5 MG TABLET] 30 tablet     Sig: TAKE 1 TABLET BY MOUTH AT BEDTIME AS NEEDED FOR SLEEP     Not Delegated - Psychiatry:  Anxiolytics/Hypnotics Failed - 07/04/2023  8:47 AM      Failed - This refill cannot be delegated      Failed - Urine Drug Screen completed in last 360 days      Passed - Valid encounter within last 6 months    Recent Outpatient Visits           3 weeks ago Hepatic steatosis   [place] [facility] at [facility], Nyoka Cowden, MD   1 month ago Acute vaginitis   [place] [facility] at [facility], Nyoka Cowden, MD       Future Appointments             In 5 months Judithann Graves Nyoka Cowden, MD [facility] Primary Care & Sports Medicine at [facility], [facility]               Requested Prescriptions  Pending Prescriptions Disp Refills   zolpidem (AMBIEN) 5 MG tablet [Pharmacy Med Name: ZOLPIDEM TARTRATE 5 MG TABLET] 30 tablet     Sig: TAKE 1 TABLET BY MOUTH AT BEDTIME AS NEEDED FOR SLEEP     Not Delegated - Psychiatry:  Anxiolytics/Hypnotics Failed - 07/04/2023  8:47 AM      Failed - This refill cannot be delegated      Failed - Urine Drug Screen completed in last 360 days      Passed - Valid encounter within last 6 months    Recent Outpatient Visits           3 weeks ago Hepatic steatosis   [place] [facility] at [facility], Nyoka Cowden, MD   1 month ago Acute vaginitis   [facility] Primary Care & Sports Medicine at [facility], Nyoka Cowden, MD       Future Appointments             In 5 months  Judithann Graves, Nyoka Cowden, MD [facility] Primary Care & Sports Medicine at [facility], [facility]

## 2023-09-09 ENCOUNTER — Encounter: Payer: Self-pay | Admitting: Internal Medicine

## 2023-09-10 ENCOUNTER — Other Ambulatory Visit (HOSPITAL_COMMUNITY)
Admission: RE | Admit: 2023-09-10 | Discharge: 2023-09-10 | Disposition: A | Discharge status: Home / Self Care | Source: Ambulatory Visit | Attending: Internal Medicine | Admitting: Internal Medicine

## 2023-09-10 ENCOUNTER — Encounter: Payer: Self-pay | Admitting: Internal Medicine

## 2023-09-10 ENCOUNTER — Ambulatory Visit: Admitting: Internal Medicine

## 2023-09-10 VITALS — BP 124/78 | HR 77 | Ht 64.0 in | Wt 181.0 lb

## 2023-09-10 DIAGNOSIS — N898 Other specified noninflammatory disorders of vagina: Secondary | ICD-10-CM | POA: Diagnosis present

## 2023-09-10 DIAGNOSIS — N760 Acute vaginitis: Secondary | ICD-10-CM

## 2023-09-10 MED ORDER — METRONIDAZOLE 500 MG PO TABS
500.0000 mg | ORAL_TABLET | Freq: Two times a day (BID) | ORAL | 0 refills | Status: AC
Start: 2023-09-10 — End: 2023-09-17

## 2023-09-10 NOTE — Progress Notes (Signed)
 Date:  09/10/2023   Name:  Robin  KYLAN Madden   DOB:  1965/06/11   MRN:  [id number]   Chief Complaint: Exposure to STD (Screening for STD's.)  Exposure to STD  The patient's primary symptoms include a discharge and dysuria. This is a recurrent problem.  She was treated several months ago for Trich with resolution of symptoms.  Since then has not been sexually active other than some close genital to genital contact on one occasion.  Now having heavy discharge and severe itching again.  Her friend was just tested 2 days ago and is waiting for results.  Review of Systems  Constitutional:  Negative for chills and fatigue.  Genitourinary:  Positive for dysuria and vaginal discharge. Negative for urgency.     Lab Results  Component Value Date   NA 140 06/09/2023   K 3.8 06/09/2023   CO2 25 06/09/2023   GLUCOSE 104 (H) 06/09/2023   BUN 9 06/09/2023   CREATININE 0.70 06/09/2023   CALCIUM 10.4 (H) 06/09/2023   EGFR 100 06/09/2023   GFRNONAA 91 04/06/2020   Lab Results  Component Value Date   CHOL 326 (H) 06/09/2023   HDL 69 06/09/2023   LDLCALC 231 (H) 06/09/2023   TRIG 142 06/09/2023   CHOLHDL 4.7 (H) 06/09/2023   Lab Results  Component Value Date   TSH 2.530 01/02/2023   Lab Results  Component Value Date   HGBA1C 5.2 05/04/2021   Lab Results  Component Value Date   WBC 3.9 06/09/2023   HGB 13.5 06/09/2023   HCT 38.6 06/09/2023   MCV 109 (H) 06/09/2023   PLT 128 (L) 06/09/2023   Lab Results  Component Value Date   ALT 48 (H) 06/09/2023   AST 79 (H) 06/09/2023   ALKPHOS 101 06/09/2023   BILITOT 1.0 06/09/2023   Lab Results  Component Value Date   VD25OH 33.7 10/03/2017     Patient Active Problem List   Diagnosis Date Noted   Overweight (BMI 25.0-29.9) 06/17/2023   Hepatic steatosis 01/07/2023   Restless legs syndrome (RLS) 01/02/2023   Mixed hyperlipidemia 10/06/2017   Carpal tunnel syndrome on right 09/20/2016   Neuritis of right ulnar nerve  08/22/2016   Bilateral wrist pain 07/18/2016   Acquired hypothyroidism 04/04/2015   OCD (obsessive compulsive disorder) 04/04/2015   Atypical facial pain 04/04/2015   Primary insomnia 04/04/2015   Macrocytosis 04/04/2015   Hx of abnormal cervical Pap smear 04/04/2015    No Known Allergies  Past Surgical History:  Procedure Laterality Date   CARPAL TUNNEL RELEASE Right 02/2017   TONSILLECTOMY     ULNAR COLLATERAL LIGAMENT REPAIR Right 09/06/2016   Procedure: RIGHT ULNA NERVE TRANSPORTATION;  Surgeon: Josephus Nida, MD;  Location: [facility];  Service: Orthopedics;  Laterality: Right;    Social History   Tobacco Use   Smoking status: Never   Smokeless tobacco: Never  Vaping Use   Vaping status: Never Used  Substance Use Topics   Alcohol use: Yes    Alcohol/week: 14.0 standard drinks of alcohol    Types: 7 Glasses of wine, 7 Shots of liquor per week    Comment: daily   Drug use: No     Medication list has been reviewed and updated.  Current Meds  Medication Sig   Ascorbic Acid (VITAMIN C PO) Take by mouth.   escitalopram  (LEXAPRO ) 20 MG tablet TAKE 1 TABLET BY MOUTH EVERY DAY   Folic Acid-Vit B6-Vit B12 (FOLBEE) 2.5-25-1 MG TABS  tablet Take 1 tablet by mouth daily.   gabapentin  (NEURONTIN ) 300 MG capsule Take 1 capsule (300 mg total) by mouth 3 (three) times daily.   levothyroxine  (SYNTHROID ) 100 MCG tablet TAKE 1 TABLET BY MOUTH EVERY DAY BEFORE BREAKFAST   Multiple Vitamins-Minerals (ZINC PO) Take by mouth.   tirzepatide  (ZEPBOUND ) 2.5 MG/0.5ML injection vial Inject 2.5 mg into the skin once a week.   VITAMIN D  PO Take by mouth.   zolpidem  (AMBIEN ) 5 MG tablet TAKE 1 TABLET BY MOUTH AT BEDTIME AS NEEDED FOR SLEEP       09/10/2023    1:20 PM 06/09/2023    8:37 AM 01/02/2023    8:26 AM 04/01/2022   11:14 AM  GAD 7 : Generalized Anxiety Score  Nervous, Anxious, on Edge 1 0 1 1  Control/stop worrying 0 1 1 2   Worry too much - different things 0 0 1 2   Trouble relaxing 0 0 0 1  Restless 0 0 0 0  Easily annoyed or irritable 0 0 2 0  Afraid - awful might happen 0 0 0 0  Total GAD 7 Score 1 1 5 6   Anxiety Difficulty Not difficult at all Not difficult at all Somewhat difficult Not difficult at all       09/10/2023    1:20 PM 06/09/2023    8:36 AM 01/02/2023    8:26 AM  Depression screen PHQ 2/9  Decreased Interest 0 0 0  Down, Depressed, Hopeless 0 0 1  PHQ - 2 Score 0 0 1  Altered sleeping 0 0 3  Tired, decreased energy 0 1 3  Change in appetite 0 0 0  Feeling bad or failure about yourself  0 0 0  Trouble concentrating 0 0 1  Moving slowly or fidgety/restless 0 0 0  Suicidal thoughts 0 0 0  PHQ-9 Score 0 1 8  Difficult doing work/chores Not difficult at all Not difficult at all Somewhat difficult    BP Readings from Last 3 Encounters:  09/10/23 124/78  06/09/23 122/78  05/20/23 126/74    Physical Exam Vitals and nursing note reviewed.  Constitutional:      General: She is not in acute distress.    Appearance: Normal appearance. She is well-developed.  HENT:     Head: Normocephalic and atraumatic.   Cardiovascular:     Rate and Rhythm: Normal rate and regular rhythm.  Pulmonary:     Effort: Pulmonary effort is normal. No respiratory distress.     Breath sounds: No wheezing or rhonchi.  Abdominal:     General: Abdomen is flat.     Palpations: Abdomen is soft.     Tenderness: There is no abdominal tenderness.   Skin:    General: Skin is warm and dry.     Findings: No rash.   Neurological:     Mental Status: She is alert and oriented to person, place, and time.   Psychiatric:        Mood and Affect: Mood normal.        Behavior: Behavior normal.     Wt Readings from Last 3 Encounters:  09/10/23 181 lb (82.1 kg)  06/09/23 173 lb 4 oz (78.6 kg)  05/20/23 173 lb (78.5 kg)    BP 124/78   Pulse 77   Ht 5' 4 (1.626 m)   Wt 181 lb (82.1 kg)   LMP 07/15/2017 (Exact Date)   SpO2 98%   BMI 31.07 kg/m    Assessment  and Plan:  Problem List Items Addressed This Visit   None Visit Diagnoses       Vaginal discharge    -  Primary   Relevant Orders   Cervicovaginal ancillary only     Acute vaginitis       use diaper rash ointment as needed or Coconut oil prn   Relevant Medications   metroNIDAZOLE  (FLAGYL ) 500 MG tablet       No follow-ups on file.    Sheron Dixons, MD [facility] Primary Care and Sports Medicine [place]

## 2023-09-11 LAB — CERVICOVAGINAL ANCILLARY ONLY
Bacterial Vaginitis (gardnerella): POSITIVE — AB
Candida Glabrata: NEGATIVE
Candida Vaginitis: NEGATIVE
Chlamydia: NEGATIVE
Comment: NEGATIVE
Comment: NEGATIVE
Comment: NEGATIVE
Comment: NEGATIVE
Comment: NEGATIVE
Comment: NORMAL
Neisseria Gonorrhea: NEGATIVE
Trichomonas: NEGATIVE

## 2023-09-12 ENCOUNTER — Ambulatory Visit: Payer: Self-pay | Admitting: Internal Medicine

## 2023-09-22 IMAGING — MG MM DIGITAL SCREENING BILAT W/ TOMO AND CAD
8 series · 8 of 24 positions shown · non-contrast
Comparison: Previous exam(s).

CLINICAL DATA: Screening.

EXAM:
DIGITAL SCREENING BILATERAL MAMMOGRAM WITH TOMOSYNTHESIS AND CAD
TECHNIQUE: Bilateral screening digital craniocaudal and mediolateral oblique
mammograms were obtained. Bilateral screening digital breast
tomosynthesis was performed. The images were evaluated with
computer-aided detection.

[L MLO synth-2D]
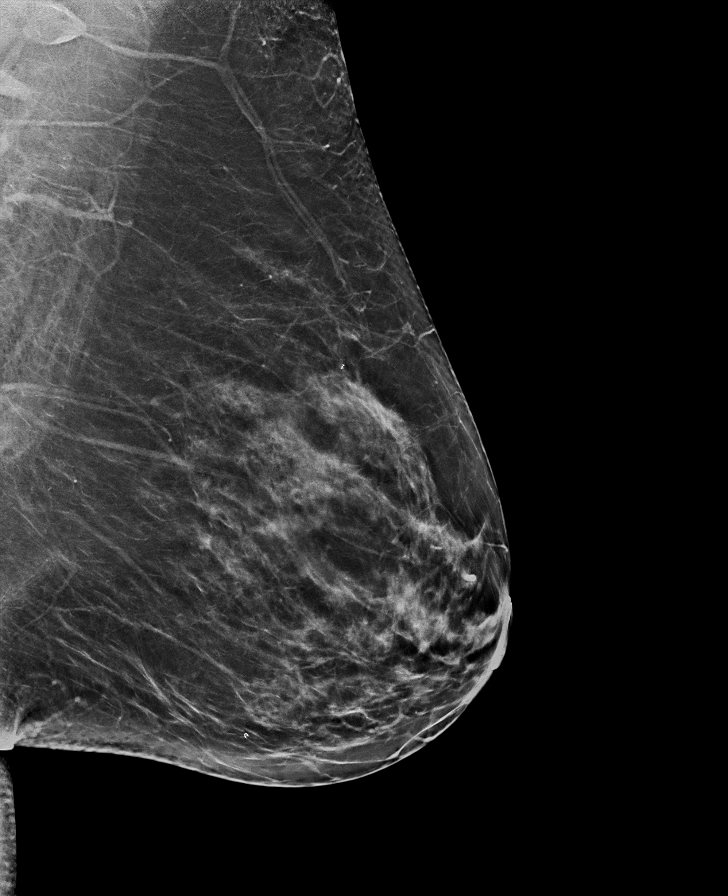

[L CC synth-2D]
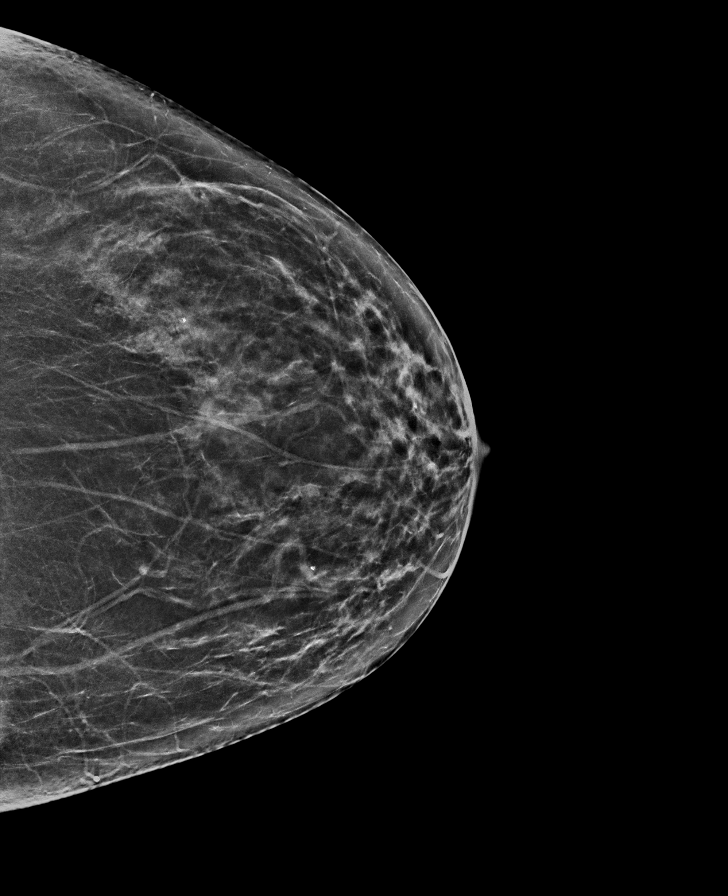

[R CC synth-2D]
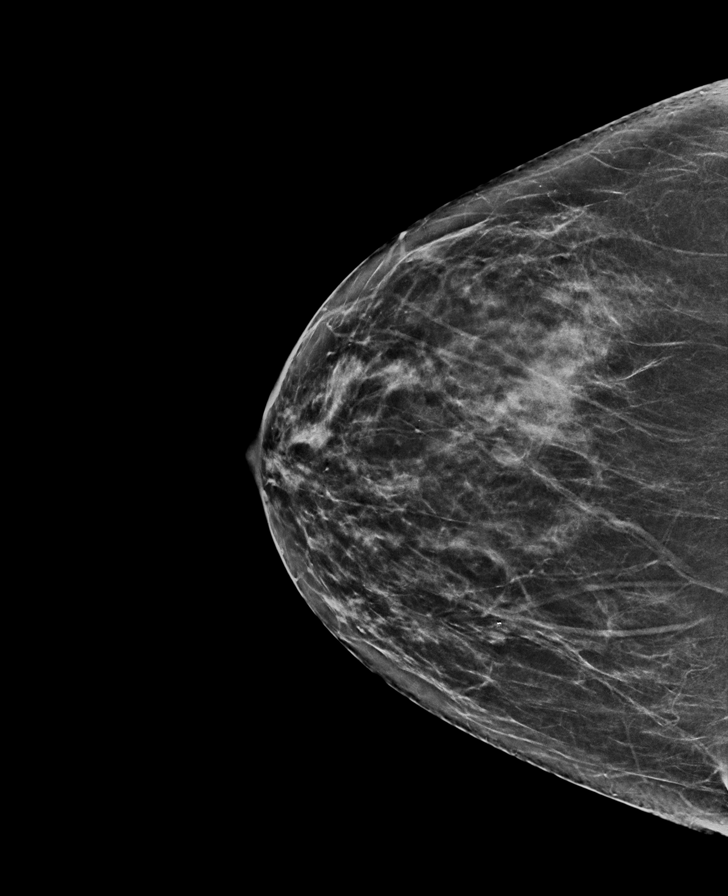

[R MLO synth-2D]
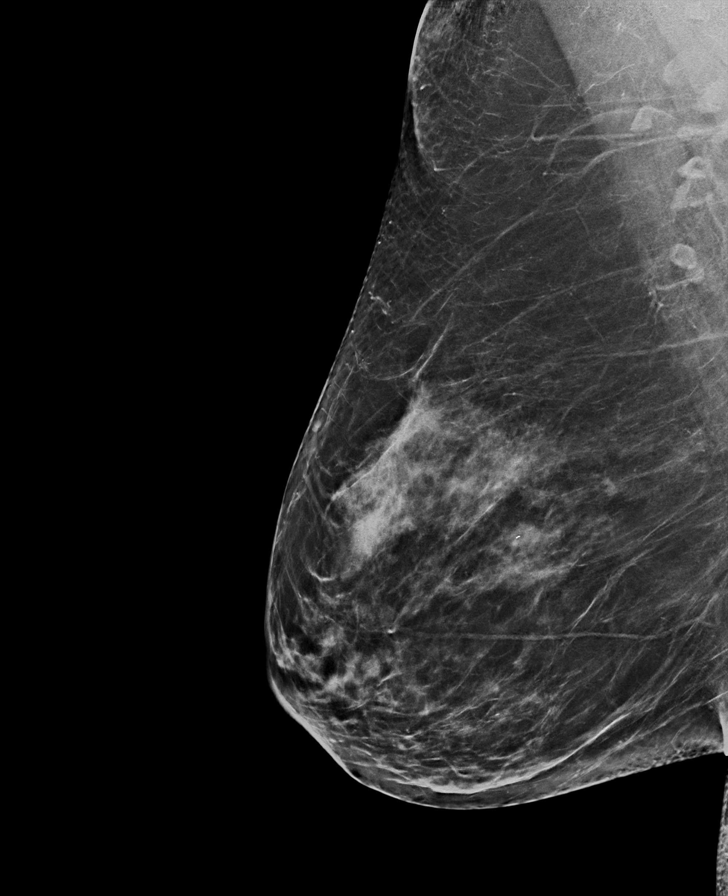

[L CC tomo · tomo slice 35/69.0]
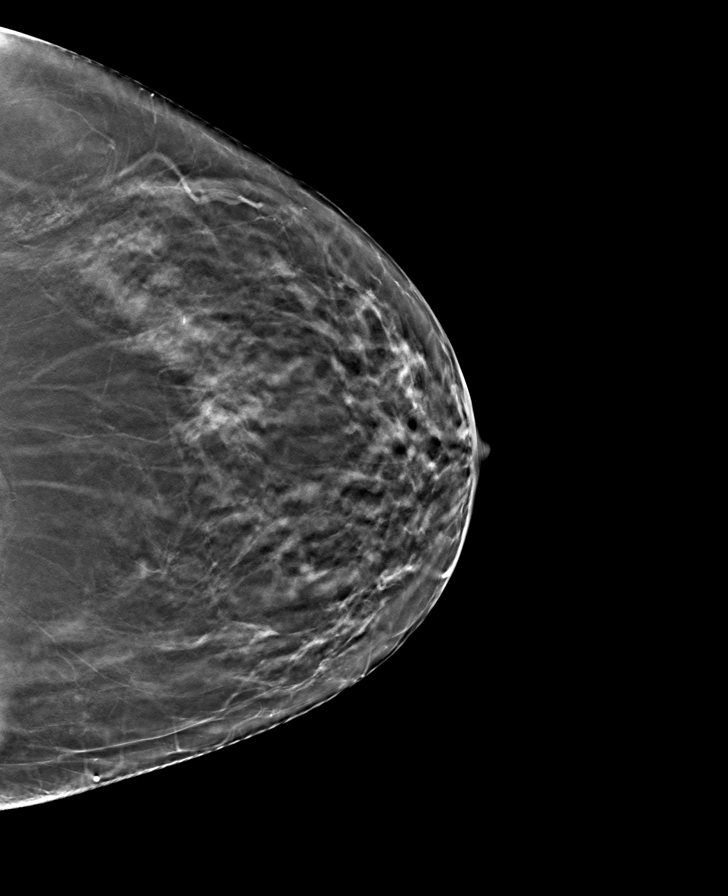

[R CC tomo · tomo slice 33/65.0]
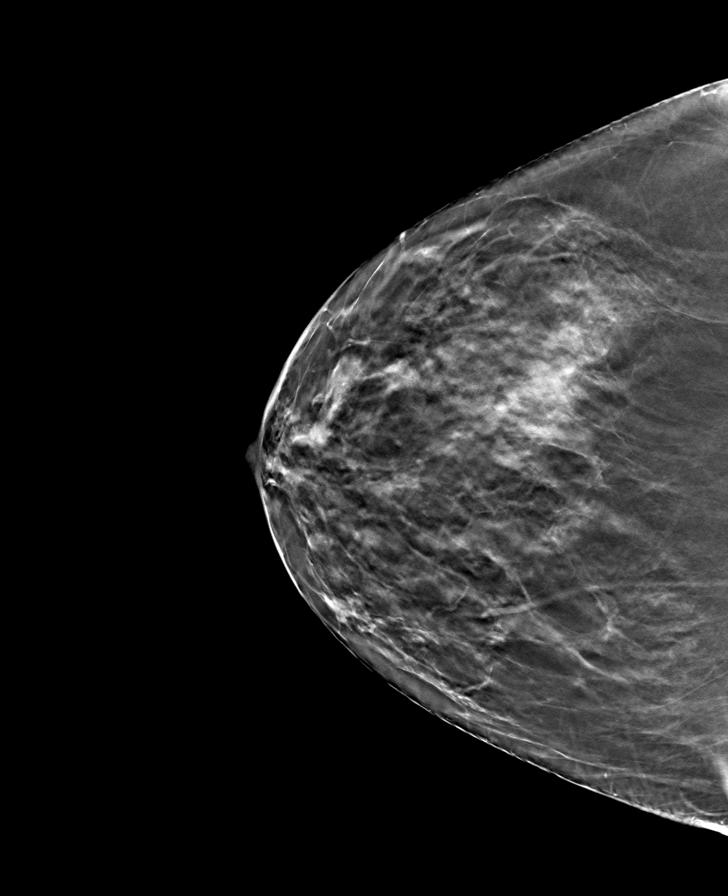

[R MLO tomo · tomo slice 40/79.0]
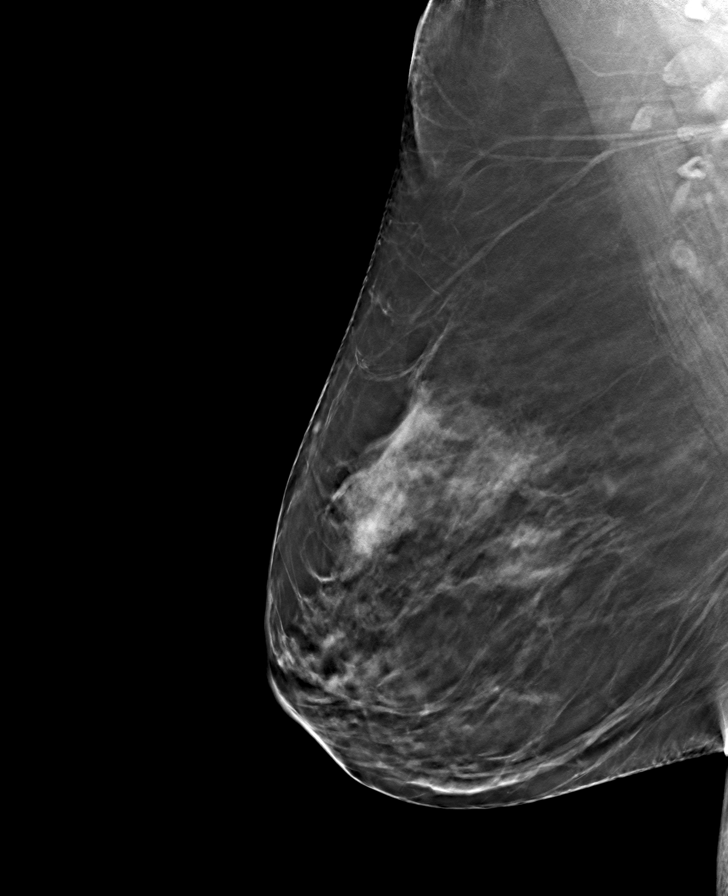

[L MLO tomo · tomo slice 40/79.0]
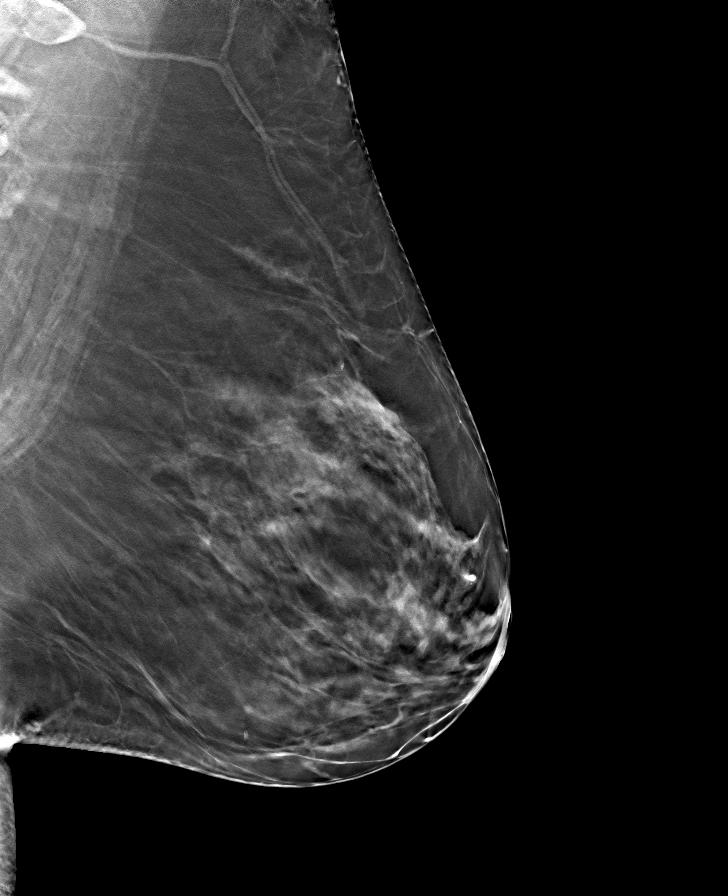

[8 of 24 positions shown; findings below may reference images not displayed]

ACR Breast Density Category c: The breast tissue is heterogeneously
dense, which may obscure small masses.
FINDINGS: There are no findings suspicious for malignancy.
IMPRESSION: No mammographic evidence of malignancy. A result letter of this
screening mammogram will be mailed directly to the patient.

RECOMMENDATION:
Screening mammogram in one year. (Code:Q3-W-BC3)

BI-RADS CATEGORY  1: Negative.

## 2023-10-14 ENCOUNTER — Other Ambulatory Visit: Payer: Self-pay | Admitting: Internal Medicine

## 2023-10-14 DIAGNOSIS — F5101 Primary insomnia: Secondary | ICD-10-CM

## 2023-10-16 NOTE — Telephone Encounter (Signed)
 Requested medication (s) are due for refill today: na   Requested medication (s) are on the active medication list: yes   Last refill:  07/04/23 #30 1 refills  Future visit scheduled: yes in 2 months  Notes to clinic:  not delegated per protocol do you want to refill Rx?     Requested Prescriptions  Pending Prescriptions Disp Refills   zolpidem  (AMBIEN ) 5 MG tablet [Pharmacy Med Name: ZOLPIDEM  TARTRATE 5 MG TABLET] 30 tablet     Sig: TAKE 1 TABLET BY MOUTH EVERY DAY AT BEDTIME AS NEEDED FOR SLEEP     Not Delegated - Psychiatry:  Anxiolytics/Hypnotics Failed - 10/16/2023  1:15 PM      Failed - This refill cannot be delegated      Failed - Urine Drug Screen completed in last 360 days      Passed - Valid encounter within last 6 months    Recent Outpatient Visits           1 month ago Vaginal discharge    Primary Care & Sports Medicine at [facility], Leita DEL, MD   4 months ago Hepatic steatosis   [facility] Primary Care [facility] Sports Medicine at [facility], Leita DEL, MD   4 months ago Acute vaginitis   [facility] Care & Sports Medicine at [facility], Leita DEL, MD       Future Appointments             In 2 months Justus, Leita DEL, MD [facility] Primary Care & Sports Medicine at [facility], [facility]

## 2023-10-16 NOTE — Telephone Encounter (Signed)
 Please review.  KP

## 2023-11-19 ENCOUNTER — Other Ambulatory Visit: Payer: Self-pay | Admitting: Internal Medicine

## 2023-11-19 DIAGNOSIS — G501 Atypical facial pain: Secondary | ICD-10-CM

## 2023-11-20 NOTE — Telephone Encounter (Signed)
 Requested Prescriptions  Pending Prescriptions Disp Refills   gabapentin  (NEURONTIN ) 300 MG capsule [Pharmacy Med Name: GABAPENTIN  300 MG CAPSULE] 90 capsule 1    Sig: TAKE 1 CAPSULE (300 MG TOTAL) BY MOUTH DAILY AS NEEDED.     Neurology: Anticonvulsants - gabapentin  Passed - 11/20/2023  1:54 PM      Passed - Cr in normal range and within 360 days    Creatinine, Ser  Date Value Ref Range Status  06/09/2023 0.70 0.57 - 1.00 mg/dL Final         Passed - Completed PHQ-2 or PHQ-9 in the last 360 days      Passed - Valid encounter within last 12 months    Recent Outpatient Visits           2 months ago Vaginal discharge   [facility] Primary Care & Sports Medicine at [facility], Leita DEL, MD   5 months ago Hepatic steatosis   [facility] Primary Care & Sports Medicine at [facility], Leita DEL, MD   6 months ago Acute vaginitis   [facility] Primary Care & Sports Medicine at [facility], Leita DEL, MD       Future Appointments             In 1 month Justus, Leita DEL, MD [facility] Primary Care & Sports Medicine at [facility], [phone] [place]

## 2023-11-23 ENCOUNTER — Other Ambulatory Visit: Payer: Self-pay | Admitting: Internal Medicine

## 2023-11-23 DIAGNOSIS — E039 Hypothyroidism, unspecified: Secondary | ICD-10-CM

## 2023-11-25 NOTE — Telephone Encounter (Signed)
 Requested Prescriptions  Refused Prescriptions Disp Refills   levothyroxine  (SYNTHROID ) 100 MCG tablet [Pharmacy Med Name: LEVOTHYROXINE  100 MCG TABLET] 90 tablet 2    Sig: TAKE 1 TABLET BY MOUTH EVERY DAY BEFORE BREAKFAST     Endocrinology:  Hypothyroid Agents Passed - 11/25/2023 12:31 PM      Passed - TSH in normal range and within 360 days    TSH  Date Value Ref Range Status  01/02/2023 2.530 0.450 - 4.500 uIU/mL Final         Passed - Valid encounter within last 12 months    Recent Outpatient Visits           2 months ago Vaginal discharge   [facility] Primary Care [facility] Sports Medicine at [facility], Leita DEL, MD   5 months ago Hepatic steatosis   [facility] Primary Care & Sports Medicine at [facility], Leita DEL, MD   6 months ago Acute vaginitis   [facility] Primary Care & Sports Medicine at [facility], Leita DEL, MD       Future Appointments             In 3 weeks Justus Leita DEL, MD [facility] Primary Care & Sports Medicine at [facility], [phone] [place]

## 2023-12-04 ENCOUNTER — Encounter: Payer: Self-pay | Admitting: Internal Medicine

## 2023-12-04 ENCOUNTER — Other Ambulatory Visit: Payer: Self-pay

## 2023-12-04 DIAGNOSIS — E039 Hypothyroidism, unspecified: Secondary | ICD-10-CM

## 2023-12-04 MED ORDER — LEVOTHYROXINE SODIUM 100 MCG PO TABS: ORAL_TABLET | ORAL | 0 refills | Status: DC

## 2023-12-22 ENCOUNTER — Encounter: Payer: Self-pay | Admitting: Internal Medicine

## 2023-12-22 ENCOUNTER — Ambulatory Visit (INDEPENDENT_AMBULATORY_CARE_PROVIDER_SITE_OTHER): Admitting: Internal Medicine

## 2023-12-22 VITALS — BP 124/72 | HR 72 | Ht 64.0 in | Wt 182.0 lb

## 2023-12-22 DIAGNOSIS — E782 Mixed hyperlipidemia: Secondary | ICD-10-CM

## 2023-12-22 DIAGNOSIS — E039 Hypothyroidism, unspecified: Secondary | ICD-10-CM

## 2023-12-22 DIAGNOSIS — K760 Fatty (change of) liver, not elsewhere classified: Secondary | ICD-10-CM | POA: Diagnosis not present

## 2023-12-22 DIAGNOSIS — F422 Mixed obsessional thoughts and acts: Secondary | ICD-10-CM

## 2023-12-22 DIAGNOSIS — F1020 Alcohol dependence, uncomplicated: Secondary | ICD-10-CM | POA: Insufficient documentation

## 2023-12-22 DIAGNOSIS — G5602 Carpal tunnel syndrome, left upper limb: Secondary | ICD-10-CM

## 2023-12-22 DIAGNOSIS — Z789 Other specified health status: Secondary | ICD-10-CM | POA: Insufficient documentation

## 2023-12-22 DIAGNOSIS — G501 Atypical facial pain: Secondary | ICD-10-CM

## 2023-12-22 DIAGNOSIS — Z0000 Encounter for general adult medical examination without abnormal findings: Secondary | ICD-10-CM

## 2023-12-22 DIAGNOSIS — Z1231 Encounter for screening mammogram for malignant neoplasm of breast: Secondary | ICD-10-CM | POA: Diagnosis not present

## 2023-12-22 DIAGNOSIS — D7589 Other specified diseases of blood and blood-forming organs: Secondary | ICD-10-CM

## 2023-12-22 DIAGNOSIS — Z23 Encounter for immunization: Secondary | ICD-10-CM

## 2023-12-22 MED ORDER — LEVOTHYROXINE SODIUM 100 MCG PO TABS: ORAL_TABLET | ORAL | 1 refills | Status: AC

## 2023-12-22 MED ORDER — CLONAZEPAM 0.5 MG PO TABS: 0.5000 mg | ORAL_TABLET | Freq: Every evening | ORAL | 1 refills | Status: DC | PRN

## 2023-12-22 MED ORDER — COVID-19 MRNA VAC-TRIS(PFIZER) 30 MCG/0.3ML IM SUSY: 0.3000 mL | PREFILLED_SYRINGE | Freq: Once | INTRAMUSCULAR | 0 refills | Status: AC

## 2023-12-22 MED ORDER — ESCITALOPRAM OXALATE 20 MG PO TABS: 20.0000 mg | ORAL_TABLET | Freq: Every day | ORAL | 1 refills | Status: AC

## 2023-12-22 NOTE — Assessment & Plan Note (Signed)
 Supplemented

## 2023-12-22 NOTE — Assessment & Plan Note (Addendum)
 Discussed cutting back on intake and using clonazepam nightly instead of ambien  She is not interested in counseling at this time

## 2023-12-22 NOTE — Assessment & Plan Note (Signed)
 Working on diet currently. Lab Results  Component Value Date   LDLCALC 231 (H) 06/09/2023

## 2023-12-22 NOTE — Progress Notes (Signed)
 Date:  12/22/2023   Name:  Robin Madden  Robin Madden   DOB:  08/23/1965   MRN:  [id number]   Chief Complaint: Annual Exam Robin  Robin Madden is a 58 y.o. female who presents today for her Complete Annual Exam. She feels well. She reports exercising. She reports she is sleeping fairly well. Breast complaints - none.  Health Maintenance  Topic Date Due   HIV Screening  Never done   Hepatitis B Vaccine (1 of 3 - 19+ 3-dose series) Never done   Pneumococcal Vaccine for age over 54 (1 of 1 - PCV) Never done   Breast Cancer Screening  01/02/2024   Cologuard (Stool DNA test)  05/09/2024   Pap with HPV screening  04/06/2025   DTaP/Tdap/Td vaccine (2 - Td or Tdap) 09/19/2025   Flu Shot  Completed   COVID-19 Vaccine  Completed   Hepatitis C Screening  Completed   Zoster (Shingles) Vaccine  Completed   HPV Vaccine  Aged Out   Meningitis B Vaccine  Aged Out   Thyroid Problem Presents for follow-up visit. Symptoms include anxiety. Patient reports no constipation, diarrhea, fatigue or palpitations. The symptoms have been stable.  Depression        This is a chronic problem.The problem is unchanged.  Associated symptoms include insomnia.  Associated symptoms include no fatigue, no myalgias and no headaches.  Past treatments include SSRIs - Selective serotonin reuptake inhibitors.  Compliance with treatment is good.  Past medical history includes thyroid problem.   Hepatic steatosis - recommend weight loss and alcohol cessation.  Mounjaro  was not covered.  She continues to drink daily.  Fib-4 score was very high.  US  showed steatosis.  If not improved alcohol reduction, would recommend Elastrography.  Review of Systems  Constitutional:  Negative for fatigue and unexpected weight change.  HENT:  Negative for trouble swallowing.   Eyes:  Negative for visual disturbance.  Respiratory:  Negative for cough, chest tightness, shortness of breath and wheezing.   Cardiovascular:  Negative for chest pain,  palpitations and leg swelling.  Gastrointestinal:  Negative for abdominal pain, constipation and diarrhea.  Genitourinary:  Negative for dysuria and urgency.  Musculoskeletal:  Positive for arthralgias (wrist, neck and hip). Negative for myalgias.  Skin:  Negative for color change and rash.  Neurological:  Negative for dizziness, weakness, light-headedness and headaches.  Psychiatric/Behavioral:  Positive for depression, dysphoric mood and sleep disturbance. The patient is nervous/anxious and has insomnia.      Lab Results  Component Value Date   NA 140 06/09/2023   K 3.8 06/09/2023   CO2 25 06/09/2023   GLUCOSE 104 (H) 06/09/2023   BUN 9 06/09/2023   CREATININE 0.70 06/09/2023   CALCIUM 10.4 (H) 06/09/2023   EGFR 100 06/09/2023   GFRNONAA 91 04/06/2020   Lab Results  Component Value Date   CHOL 326 (H) 06/09/2023   HDL 69 06/09/2023   LDLCALC 231 (H) 06/09/2023   TRIG 142 06/09/2023   CHOLHDL 4.7 (H) 06/09/2023   Lab Results  Component Value Date   TSH 2.530 01/02/2023   Lab Results  Component Value Date   HGBA1C 5.2 05/04/2021   Lab Results  Component Value Date   WBC 3.9 06/09/2023   HGB 13.5 06/09/2023   HCT 38.6 06/09/2023   MCV 109 (H) 06/09/2023   PLT 128 (L) 06/09/2023   Lab Results  Component Value Date   ALT 48 (H) 06/09/2023   AST 79 (H) 06/09/2023   ALKPHOS  101 06/09/2023   BILITOT 1.0 06/09/2023   Lab Results  Component Value Date   VD25OH 33.7 10/03/2017     Patient Active Problem List   Diagnosis Date Noted   Alcohol dependence, daily use (HCC) 12/22/2023   Overweight (BMI 25.0-29.9) 06/17/2023   Hepatic steatosis 01/07/2023   Restless legs syndrome (RLS) 01/02/2023   Mixed hyperlipidemia 10/06/2017   Carpal tunnel syndrome on left 09/20/2016   Neuritis of right ulnar nerve 08/22/2016   Bilateral wrist pain 07/18/2016   Acquired hypothyroidism 04/04/2015   OCD (obsessive compulsive disorder) 04/04/2015   Atypical facial pain  04/04/2015   Primary insomnia 04/04/2015   Macrocytosis 04/04/2015   Hx of abnormal cervical Pap smear 04/04/2015    No Known Allergies  Past Surgical History:  Procedure Laterality Date   CARPAL TUNNEL RELEASE Right 02/2017   TONSILLECTOMY     ULNAR COLLATERAL LIGAMENT REPAIR Right 09/06/2016   Procedure: RIGHT ULNA NERVE TRANSPORTATION;  Surgeon: Maryl Barters, MD;  Location: [facility];  Service: Orthopedics;  Laterality: Right;    Social History   Tobacco Use   Smoking status: Never   Smokeless tobacco: Never  Vaping Use   Vaping status: Never Used  Substance Use Topics   Alcohol use: Yes    Alcohol/week: 14.0 standard drinks of alcohol    Types: 7 Glasses of wine, 7 Shots of liquor per week    Comment: daily   Drug use: No     Medication list has been reviewed and updated.  Current Meds  Medication Sig   Ascorbic Acid (VITAMIN C PO) Take by mouth.   clonazePAM (KLONOPIN) 0.5 MG tablet Take 1 tablet (0.5 mg total) by mouth at bedtime as needed for anxiety.   COVID-19 mRNA vaccine, Pfizer, (COMIRNATY) syringe Inject 0.3 mLs into the muscle once for 1 dose.   Folic Acid-Vit B6-Vit B12 (FOLBEE) 2.5-25-1 MG TABS tablet Take 1 tablet by mouth daily.   gabapentin  (NEURONTIN ) 300 MG capsule TAKE 1 CAPSULE (300 MG TOTAL) BY MOUTH DAILY AS NEEDED.   Multiple Vitamins-Minerals (ZINC PO) Take by mouth.   VITAMIN D  PO Take by mouth.   [DISCONTINUED] escitalopram  (LEXAPRO ) 20 MG tablet TAKE 1 TABLET BY MOUTH EVERY DAY   [DISCONTINUED] levothyroxine  (SYNTHROID ) 100 MCG tablet TAKE 1 TABLET BY MOUTH EVERY DAY BEFORE BREAKFAST   [DISCONTINUED] tirzepatide  (ZEPBOUND ) 2.5 MG/0.5ML injection vial Inject 2.5 mg into the skin once a week.   [DISCONTINUED] zolpidem  (AMBIEN ) 5 MG tablet TAKE 1 TABLET BY MOUTH EVERY DAY AT BEDTIME AS NEEDED FOR SLEEP       12/22/2023    8:41 AM 09/10/2023    1:20 PM 06/09/2023    8:37 AM 01/02/2023    8:26 AM  GAD 7 : Generalized Anxiety  Score  Nervous, Anxious, on Edge 1 1 0 1  Control/stop worrying 0 0 1 1  Worry too much - different things 0 0 0 1  Trouble relaxing 0 0 0 0  Restless 0 0 0 0  Easily annoyed or irritable 1 0 0 2  Afraid - awful might happen 0 0 0 0  Total GAD 7 Score 2 1 1 5   Anxiety Difficulty Not difficult at all Not difficult at all Not difficult at all Somewhat difficult       12/22/2023    8:41 AM 09/10/2023    1:20 PM 06/09/2023    8:36 AM  Depression screen PHQ 2/9  Decreased Interest 0 0 0  Down, Depressed,  Hopeless 0 0 0  PHQ - 2 Score 0 0 0  Altered sleeping 0 0 0  Tired, decreased energy 0 0 1  Change in appetite 0 0 0  Feeling bad or failure about yourself  0 0 0  Trouble concentrating 0 0 0  Moving slowly or fidgety/restless 0 0 0  Suicidal thoughts 0 0 0  PHQ-9 Score 0 0 1  Difficult doing work/chores Not difficult at all Not difficult at all Not difficult at all    BP Readings from Last 3 Encounters:  12/22/23 124/72  09/10/23 124/78  06/09/23 122/78    Physical Exam Vitals and nursing note reviewed.  Constitutional:      General: She is not in acute distress.    Appearance: She is well-developed.  HENT:     Head: Normocephalic and atraumatic.     Right Ear: Tympanic membrane and ear canal normal.     Left Ear: Tympanic membrane and ear canal normal.     Nose:     Right Sinus: No maxillary sinus tenderness.     Left Sinus: No maxillary sinus tenderness.  Eyes:     General: No scleral icterus.       Right eye: No discharge.        Left eye: No discharge.     Conjunctiva/sclera: Conjunctivae normal.  Neck:     Thyroid: No thyromegaly.     Vascular: No carotid bruit.  Cardiovascular:     Rate and Rhythm: Normal rate and regular rhythm.     Pulses: Normal pulses.     Heart sounds: Normal heart sounds.  Pulmonary:     Effort: Pulmonary effort is normal. No respiratory distress.     Breath sounds: No wheezing.  Abdominal:     General: Bowel sounds are normal.      Palpations: Abdomen is soft.     Tenderness: There is no abdominal tenderness.  Musculoskeletal:        General: No swelling.     Cervical back: Normal range of motion. No erythema.     Right lower leg: No edema.     Left lower leg: No edema.  Lymphadenopathy:     Cervical: No cervical adenopathy.  Skin:    General: Skin is warm and dry.     Capillary Refill: Capillary refill takes less than 2 seconds.     Findings: No rash.  Neurological:     General: No focal deficit present.     Mental Status: She is alert and oriented to person, place, and time.     Cranial Nerves: No cranial nerve deficit.     Sensory: No sensory deficit.     Deep Tendon Reflexes: Reflexes are normal and symmetric.  Psychiatric:        Attention and Perception: Attention normal.        Mood and Affect: Mood normal.     Wt Readings from Last 3 Encounters:  12/22/23 182 lb (82.6 kg)  09/10/23 181 lb (82.1 kg)  06/09/23 173 lb 4 oz (78.6 kg)    BP 124/72   Pulse 72   Ht 5' 4 (1.626 m)   Wt 182 lb (82.6 kg)   LMP 07/15/2017 (Exact Date)   SpO2 97%   BMI 31.24 kg/m   Assessment and Plan:  Problem List Items Addressed This Visit       Unprioritized   Acquired hypothyroidism (Chronic)   Supplemented.      Relevant Medications  levothyroxine  (SYNTHROID ) 100 MCG tablet   Other Relevant Orders   TSH + free T4   Alcohol dependence, daily use (HCC) (Chronic)   Discussed cutting back on intake and using clonazepam nightly instead of ambien  She is not interested in counseling at this time      Atypical facial pain   Improved with gabapentin  - now using this PRN      Carpal tunnel syndrome on left   Worsening left sided symptoms  Seeing Ortho in the near future.      Relevant Medications   clonazePAM (KLONOPIN) 0.5 MG tablet   escitalopram  (LEXAPRO ) 20 MG tablet   Hepatic steatosis (Chronic)   Discussed alcohol intake - 1/2 of a fifth per day. She is not interested in counseling  at this time.      Relevant Orders   Comprehensive metabolic panel with GFR   Macrocytosis   Recheck CBC and B12      Relevant Orders   CBC with Differential/Platelet   Vitamin B12   Mixed hyperlipidemia (Chronic)   Working on diet currently. Lab Results  Component Value Date   LDLCALC 231 (H) 06/09/2023        Relevant Orders   Lipid panel   OCD (obsessive compulsive disorder) (Chronic)   Clinically stable on Lexapro .   No SI or HI on evaluation. She continues to drink alcohol daily for relaxation/stress reduction. Ambien  is causing nightmares Will add clonazepam nightly prn.        Relevant Medications   clonazePAM (KLONOPIN) 0.5 MG tablet   escitalopram  (LEXAPRO ) 20 MG tablet   Other Visit Diagnoses       Annual physical exam    -  Primary   pap and CRC screening up to date   Relevant Orders   CBC with Differential/Platelet   Comprehensive metabolic panel with GFR   Lipid panel   TSH + free T4   Vitamin B12     Encounter for screening mammogram for breast cancer       schedule at [facility]   Relevant Orders   MM 3D SCREENING MAMMOGRAM BILATERAL BREAST     Encounter for immunization       Relevant Orders   Flu vaccine trivalent PF, 6mos and older(Flulaval,Afluria,Fluarix,Fluzone) (Completed)       Return in about 6 months (around 06/20/2024) for TOC - Depression, Thyroid, hepatic steatosis.    Leita HILARIO Adie, MD [facility] [place]

## 2023-12-22 NOTE — Assessment & Plan Note (Signed)
Recheck CBC and B12

## 2023-12-22 NOTE — Assessment & Plan Note (Signed)
 Clinically stable on Lexapro .   No SI or HI on evaluation. She continues to drink alcohol daily for relaxation/stress reduction. Ambien  is causing nightmares Will add clonazepam nightly prn.

## 2023-12-22 NOTE — Assessment & Plan Note (Signed)
 Worsening left sided symptoms  Seeing Ortho in the near future.

## 2023-12-22 NOTE — Patient Instructions (Signed)
 Call [facility] to schedule your mammogram at [phone].

## 2023-12-22 NOTE — Assessment & Plan Note (Signed)
 Discussed alcohol intake - 1/2 of a fifth per day. She is not interested in counseling at this time.

## 2023-12-22 NOTE — Assessment & Plan Note (Signed)
 Improved with gabapentin  - now using this PRN

## 2023-12-23 ENCOUNTER — Encounter: Payer: Self-pay | Admitting: Internal Medicine

## 2023-12-23 ENCOUNTER — Ambulatory Visit: Payer: Self-pay | Admitting: Internal Medicine

## 2023-12-23 DIAGNOSIS — E782 Mixed hyperlipidemia: Secondary | ICD-10-CM

## 2023-12-23 LAB — COMPREHENSIVE METABOLIC PANEL WITH GFR
ALT: 75 IU/L — ABNORMAL HIGH (ref 0–32)
AST: 97 IU/L — ABNORMAL HIGH (ref 0–40)
Albumin: 4.6 g/dL (ref 3.8–4.9)
Alkaline Phosphatase: 118 IU/L (ref 49–135)
BUN/Creatinine Ratio: 15 (ref 9–23)
BUN: 12 mg/dL (ref 6–24)
Bilirubin Total: 1 mg/dL (ref 0.0–1.2)
CO2: 24 mmol/L (ref 20–29)
Calcium: 10 mg/dL (ref 8.7–10.2)
Chloride: 98 mmol/L (ref 96–106)
Creatinine, Ser: 0.78 mg/dL (ref 0.57–1.00)
Globulin, Total: 3 g/dL (ref 1.5–4.5)
Glucose: 98 mg/dL (ref 70–99)
Potassium: 4.3 mmol/L (ref 3.5–5.2)
Sodium: 138 mmol/L (ref 134–144)
Total Protein: 7.6 g/dL (ref 6.0–8.5)
eGFR: 88 mL/min/1.73 (ref >59)

## 2023-12-23 LAB — CBC WITH DIFFERENTIAL/PLATELET
Basophils Absolute: 0 x10E3/uL (ref 0.0–0.2)
Basos: 0 %
EOS (ABSOLUTE): 0.1 x10E3/uL (ref 0.0–0.4)
Eos: 2 %
Hematocrit: 39.8 % (ref 34.0–46.6)
Hemoglobin: 13.5 g/dL (ref 11.1–15.9)
Immature Grans (Abs): 0 x10E3/uL (ref 0.0–0.1)
Immature Granulocytes: 0 %
Lymphocytes Absolute: 1.3 x10E3/uL (ref 0.7–3.1)
Lymphs: 32 %
MCH: 38.4 pg — ABNORMAL HIGH (ref 26.6–33.0)
MCHC: 33.9 g/dL (ref 31.5–35.7)
MCV: 113 fL — ABNORMAL HIGH (ref 79–97)
Monocytes Absolute: 0.3 x10E3/uL (ref 0.1–0.9)
Monocytes: 7 %
Neutrophils Absolute: 2.4 x10E3/uL (ref 1.4–7.0)
Neutrophils: 59 %
Platelets: 121 x10E3/uL — ABNORMAL LOW (ref 150–450)
RBC: 3.52 x10E6/uL — ABNORMAL LOW (ref 3.77–5.28)
RDW: 12.9 % (ref 11.7–15.4)
WBC: 4.1 x10E3/uL (ref 3.4–10.8)

## 2023-12-23 LAB — LIPID PANEL
Chol/HDL Ratio: 4.7 ratio — ABNORMAL HIGH (ref 0.0–4.4)
Cholesterol, Total: 340 mg/dL — ABNORMAL HIGH (ref 100–199)
HDL: 73 mg/dL (ref >39)
LDL Chol Calc (NIH): 237 mg/dL — ABNORMAL HIGH (ref 0–99)
Triglycerides: 159 mg/dL — ABNORMAL HIGH (ref 0–149)
VLDL Cholesterol Cal: 30 mg/dL (ref 5–40)

## 2023-12-23 LAB — TSH+FREE T4
Free T4: 1.05 ng/dL (ref 0.82–1.77)
TSH: 1.73 u[IU]/mL (ref 0.450–4.500)

## 2023-12-23 LAB — VITAMIN B12: Vitamin B-12: 435 pg/mL (ref 232–1245)

## 2024-01-05 ENCOUNTER — Encounter: Payer: Self-pay | Admitting: Internal Medicine

## 2024-01-08 ENCOUNTER — Other Ambulatory Visit: Payer: Self-pay | Admitting: Internal Medicine

## 2024-01-08 DIAGNOSIS — G501 Atypical facial pain: Secondary | ICD-10-CM

## 2024-01-08 MED ORDER — GABAPENTIN 300 MG PO CAPS: 600.0000 mg | ORAL_CAPSULE | Freq: Two times a day (BID) | ORAL | 0 refills | Status: AC

## 2024-01-08 NOTE — Progress Notes (Unsigned)
 Date:  01/08/2024   Name:  Robin  KAMIAH Madden   DOB:  1965/06/15   MRN:  [id number]   Chief Complaint: No chief complaint on file.  HPI  Review of Systems   Lab Results  Component Value Date   NA 138 12/22/2023   K 4.3 12/22/2023   CO2 24 12/22/2023   GLUCOSE 98 12/22/2023   BUN 12 12/22/2023   CREATININE 0.78 12/22/2023   CALCIUM 10.0 12/22/2023   EGFR 88 12/22/2023   GFRNONAA 91 04/06/2020   Lab Results  Component Value Date   CHOL 340 (H) 12/22/2023   HDL 73 12/22/2023   LDLCALC 237 (H) 12/22/2023   TRIG 159 (H) 12/22/2023   CHOLHDL 4.7 (H) 12/22/2023   Lab Results  Component Value Date   TSH 1.730 12/22/2023   Lab Results  Component Value Date   HGBA1C 5.2 05/04/2021   Lab Results  Component Value Date   WBC 4.1 12/22/2023   HGB 13.5 12/22/2023   HCT 39.8 12/22/2023   MCV 113 (H) 12/22/2023   PLT 121 (L) 12/22/2023   Lab Results  Component Value Date   ALT 75 (H) 12/22/2023   AST 97 (H) 12/22/2023   ALKPHOS 118 12/22/2023   BILITOT 1.0 12/22/2023   Lab Results  Component Value Date   VD25OH 33.7 10/03/2017     Patient Active Problem List   Diagnosis Date Noted   Alcohol dependence, daily use (HCC) 12/22/2023   Overweight (BMI 25.0-29.9) 06/17/2023   Hepatic steatosis 01/07/2023   Restless legs syndrome (RLS) 01/02/2023   Mixed hyperlipidemia 10/06/2017   Carpal tunnel syndrome on left 09/20/2016   Neuritis of right ulnar nerve 08/22/2016   Bilateral wrist pain 07/18/2016   Acquired hypothyroidism 04/04/2015   OCD (obsessive compulsive disorder) 04/04/2015   Atypical facial pain 04/04/2015   Primary insomnia 04/04/2015   Macrocytosis 04/04/2015   Hx of abnormal cervical Pap smear 04/04/2015    No Known Allergies  Past Surgical History:  Procedure Laterality Date   CARPAL TUNNEL RELEASE Right 02/2017   TONSILLECTOMY     ULNAR COLLATERAL LIGAMENT REPAIR Right 09/06/2016   Procedure: RIGHT ULNA NERVE TRANSPORTATION;  Surgeon:  Maryl Barters, MD;  Location: [facility];  Service: Orthopedics;  Laterality: Right;    Social History   Tobacco Use   Smoking status: Never   Smokeless tobacco: Never  Vaping Use   Vaping status: Never Used  Substance Use Topics   Alcohol use: Yes    Alcohol/week: 14.0 standard drinks of alcohol    Types: 7 Glasses of wine, 7 Shots of liquor per week    Comment: daily   Drug use: No     Medication list has been reviewed and updated.  No outpatient medications have been marked as taking for the 01/08/24 encounter (Orders Only) with Justus Leita DEL, MD.       12/22/2023    8:41 AM 09/10/2023    1:20 PM 06/09/2023    8:37 AM 01/02/2023    8:26 AM  GAD 7 : Generalized Anxiety Score  Nervous, Anxious, on Edge 1 1 0 1  Control/stop worrying 0 0 1 1  Worry too much - different things 0 0 0 1  Trouble relaxing 0 0 0 0  Restless 0 0 0 0  Easily annoyed or irritable 1 0 0 2  Afraid - awful might happen 0 0 0 0  Total GAD 7 Score 2 1 1 5   Anxiety Difficulty  Not difficult at all Not difficult at all Not difficult at all Somewhat difficult       12/22/2023    8:41 AM 09/10/2023    1:20 PM 06/09/2023    8:36 AM  Depression screen PHQ 2/9  Decreased Interest 0 0 0  Down, Depressed, Hopeless 0 0 0  PHQ - 2 Score 0 0 0  Altered sleeping 0 0 0  Tired, decreased energy 0 0 1  Change in appetite 0 0 0  Feeling bad or failure about yourself  0 0 0  Trouble concentrating 0 0 0  Moving slowly or fidgety/restless 0 0 0  Suicidal thoughts 0 0 0  PHQ-9 Score 0 0 1  Difficult doing work/chores Not difficult at all Not difficult at all Not difficult at all    BP Readings from Last 3 Encounters:  12/22/23 124/72  09/10/23 124/78  06/09/23 122/78    Physical Exam  Wt Readings from Last 3 Encounters:  12/22/23 182 lb (82.6 kg)  09/10/23 181 lb (82.1 kg)  06/09/23 173 lb 4 oz (78.6 kg)    LMP 07/15/2017 (Exact Date)   Assessment and Plan:  Problem List Items  Addressed This Visit   None   No follow-ups on file.    Leita HILARIO Adie, MD [facility] [place]

## 2024-01-08 NOTE — Telephone Encounter (Signed)
 Please review.  KP

## 2024-01-19 ENCOUNTER — Other Ambulatory Visit: Payer: Self-pay | Admitting: Internal Medicine

## 2024-01-19 DIAGNOSIS — F422 Mixed obsessional thoughts and acts: Secondary | ICD-10-CM

## 2024-01-19 MED ORDER — CLONAZEPAM 0.5 MG PO TABS: 0.5000 mg | ORAL_TABLET | Freq: Every evening | ORAL | 0 refills | Status: DC | PRN

## 2024-01-19 NOTE — Telephone Encounter (Signed)
 Please review

## 2024-01-19 NOTE — Progress Notes (Unsigned)
 Date:  01/19/2024   Name:  Robin Madden  JULIE-ANNE TORAIN   DOB:  Dec 05, 1965   MRN:  [id number]   Chief Complaint: No chief complaint on file.  HPI  Review of Systems   Lab Results  Component Value Date   NA 138 12/22/2023   K 4.3 12/22/2023   CO2 24 12/22/2023   GLUCOSE 98 12/22/2023   BUN 12 12/22/2023   CREATININE 0.78 12/22/2023   CALCIUM 10.0 12/22/2023   EGFR 88 12/22/2023   GFRNONAA 91 04/06/2020   Lab Results  Component Value Date   CHOL 340 (H) 12/22/2023   HDL 73 12/22/2023   LDLCALC 237 (H) 12/22/2023   TRIG 159 (H) 12/22/2023   CHOLHDL 4.7 (H) 12/22/2023   Lab Results  Component Value Date   TSH 1.730 12/22/2023   Lab Results  Component Value Date   HGBA1C 5.2 05/04/2021   Lab Results  Component Value Date   WBC 4.1 12/22/2023   HGB 13.5 12/22/2023   HCT 39.8 12/22/2023   MCV 113 (H) 12/22/2023   PLT 121 (L) 12/22/2023   Lab Results  Component Value Date   ALT 75 (H) 12/22/2023   AST 97 (H) 12/22/2023   ALKPHOS 118 12/22/2023   BILITOT 1.0 12/22/2023   Lab Results  Component Value Date   VD25OH 33.7 10/03/2017     Patient Active Problem List   Diagnosis Date Noted   Alcohol dependence, daily use (HCC) 12/22/2023   Overweight (BMI 25.0-29.9) 06/17/2023   Hepatic steatosis 01/07/2023   Restless legs syndrome (RLS) 01/02/2023   Mixed hyperlipidemia 10/06/2017   Carpal tunnel syndrome on left 09/20/2016   Neuritis of right ulnar nerve 08/22/2016   Bilateral wrist pain 07/18/2016   Acquired hypothyroidism 04/04/2015   OCD (obsessive compulsive disorder) 04/04/2015   Atypical facial pain 04/04/2015   Primary insomnia 04/04/2015   Macrocytosis 04/04/2015   Hx of abnormal cervical Pap smear 04/04/2015    No Known Allergies  Past Surgical History:  Procedure Laterality Date   CARPAL TUNNEL RELEASE Right 02/2017   TONSILLECTOMY     ULNAR COLLATERAL LIGAMENT REPAIR Right 09/06/2016   Procedure: RIGHT ULNA NERVE TRANSPORTATION;  Surgeon:  Maryl Barters, MD;  Location: [facility];  Service: Orthopedics;  Laterality: Right;    Social History   Tobacco Use   Smoking status: Never   Smokeless tobacco: Never  Vaping Use   Vaping status: Never Used  Substance Use Topics   Alcohol use: Yes    Alcohol/week: 14.0 standard drinks of alcohol    Types: 7 Glasses of wine, 7 Shots of liquor per week    Comment: daily   Drug use: No     Medication list has been reviewed and updated.  No outpatient medications have been marked as taking for the 01/19/24 encounter (Orders Only) with Justus Leita DEL, MD.       12/22/2023    8:41 AM 09/10/2023    1:20 PM 06/09/2023    8:37 AM 01/02/2023    8:26 AM  GAD 7 : Generalized Anxiety Score  Nervous, Anxious, on Edge 1 1 0 1  Control/stop worrying 0 0 1 1  Worry too much - different things 0 0 0 1  Trouble relaxing 0 0 0 0  Restless 0 0 0 0  Easily annoyed or irritable 1 0 0 2  Afraid - awful might happen 0 0 0 0  Total GAD 7 Score 2 1 1 5   Anxiety Difficulty  Not difficult at all Not difficult at all Not difficult at all Somewhat difficult       12/22/2023    8:41 AM 09/10/2023    1:20 PM 06/09/2023    8:36 AM  Depression screen PHQ 2/9  Decreased Interest 0 0 0  Down, Depressed, Hopeless 0 0 0  PHQ - 2 Score 0 0 0  Altered sleeping 0 0 0  Tired, decreased energy 0 0 1  Change in appetite 0 0 0  Feeling bad or failure about yourself  0 0 0  Trouble concentrating 0 0 0  Moving slowly or fidgety/restless 0 0 0  Suicidal thoughts 0 0 0  PHQ-9 Score 0 0 1  Difficult doing work/chores Not difficult at all Not difficult at all Not difficult at all    BP Readings from Last 3 Encounters:  12/22/23 124/72  09/10/23 124/78  06/09/23 122/78    Physical Exam  Wt Readings from Last 3 Encounters:  12/22/23 182 lb (82.6 kg)  09/10/23 181 lb (82.1 kg)  06/09/23 173 lb 4 oz (78.6 kg)    LMP 07/15/2017 (Exact Date)   Assessment and Plan:  Problem List Items  Addressed This Visit   None   No follow-ups on file.    Leita HILARIO Adie, MD [facility] [place]

## 2024-01-19 NOTE — Telephone Encounter (Signed)
Pts response.  KP

## 2024-01-22 ENCOUNTER — Ambulatory Visit
Admission: RE | Admit: 2024-01-22 | Discharge: 2024-01-22 | Disposition: A | Discharge status: Home / Self Care | Source: Ambulatory Visit | Attending: Internal Medicine | Admitting: Internal Medicine

## 2024-01-22 DIAGNOSIS — Z1231 Encounter for screening mammogram for malignant neoplasm of breast: Secondary | ICD-10-CM | POA: Insufficient documentation

## 2024-02-04 ENCOUNTER — Other Ambulatory Visit: Payer: Self-pay | Admitting: Neurology

## 2024-02-04 ENCOUNTER — Encounter: Payer: Self-pay | Admitting: Internal Medicine

## 2024-02-04 DIAGNOSIS — M5412 Radiculopathy, cervical region: Secondary | ICD-10-CM

## 2024-02-04 DIAGNOSIS — R29898 Other symptoms and signs involving the musculoskeletal system: Secondary | ICD-10-CM

## 2024-02-04 DIAGNOSIS — R296 Repeated falls: Secondary | ICD-10-CM

## 2024-02-04 DIAGNOSIS — M21372 Foot drop, left foot: Secondary | ICD-10-CM

## 2024-02-05 ENCOUNTER — Ambulatory Visit
Admission: RE | Admit: 2024-02-05 | Discharge: 2024-02-05 | Disposition: A | Discharge status: Home / Self Care | Source: Ambulatory Visit | Attending: Neurology | Admitting: Neurology

## 2024-02-05 DIAGNOSIS — M21372 Foot drop, left foot: Secondary | ICD-10-CM | POA: Insufficient documentation

## 2024-02-05 DIAGNOSIS — M5412 Radiculopathy, cervical region: Secondary | ICD-10-CM | POA: Diagnosis present

## 2024-02-05 DIAGNOSIS — R29898 Other symptoms and signs involving the musculoskeletal system: Secondary | ICD-10-CM | POA: Insufficient documentation

## 2024-02-05 DIAGNOSIS — R296 Repeated falls: Secondary | ICD-10-CM | POA: Diagnosis present

## 2024-02-06 ENCOUNTER — Ambulatory Visit: Admitting: Internal Medicine

## 2024-02-13 ENCOUNTER — Ambulatory Visit: Admitting: Internal Medicine

## 2024-02-17 ENCOUNTER — Ambulatory Visit: Admitting: Internal Medicine

## 2024-02-23 ENCOUNTER — Other Ambulatory Visit: Payer: Self-pay | Admitting: Internal Medicine

## 2024-02-23 DIAGNOSIS — F422 Mixed obsessional thoughts and acts: Secondary | ICD-10-CM

## 2024-02-24 ENCOUNTER — Ambulatory Visit: Admitting: Internal Medicine

## 2024-02-25 ENCOUNTER — Ambulatory Visit: Admitting: Internal Medicine

## 2024-02-26 NOTE — Telephone Encounter (Signed)
 Please review.  KP

## 2024-02-26 NOTE — Telephone Encounter (Signed)
 Requested medications are due for refill today.  yes  Requested medications are on the active medications list.  yes  Last refill. 01/19/2024 #45 0 rf  Future visit scheduled.   yes  Notes to clinic.  Refill not delegated.    Requested Prescriptions  Pending Prescriptions Disp Refills   clonazePAM  (KLONOPIN ) 0.5 MG tablet [Pharmacy Med Name: CLONAZEPAM  0.5 MG TABLET] 45 tablet 0    Sig: TAKE 1 TABLET BY MOUTH AT BEDTIME AS NEEDED FOR ANXIETY. AND 1/2 TAB DAILY AS NEEDED FOR ANXIETY     Not Delegated - Psychiatry: Anxiolytics/Hypnotics 2 Failed - 02/26/2024  3:31 PM      Failed - This refill cannot be delegated      Failed - Urine Drug Screen completed in last 360 days      Passed - Patient is not pregnant      Passed - Valid encounter within last 6 months    Recent Outpatient Visits           2 months ago Annual physical exam   [facility], Leita DEL, MD   5 months ago Vaginal discharge   [facility] at [facility], Leita DEL, MD   8 months ago Hepatic steatosis   [facility] at [facility], Leita DEL, MD   9 months ago Acute vaginitis   [facility] at [facility], Leita DEL, MD

## 2024-03-01 ENCOUNTER — Ambulatory Visit: Admitting: Internal Medicine

## 2024-03-01 ENCOUNTER — Encounter: Payer: Self-pay | Admitting: Internal Medicine

## 2024-03-01 VITALS — BP 132/78 | HR 92 | Ht 64.0 in | Wt 192.0 lb

## 2024-03-01 DIAGNOSIS — K760 Fatty (change of) liver, not elsewhere classified: Secondary | ICD-10-CM | POA: Diagnosis not present

## 2024-03-01 DIAGNOSIS — Z789 Other specified health status: Secondary | ICD-10-CM

## 2024-03-01 DIAGNOSIS — M5412 Radiculopathy, cervical region: Secondary | ICD-10-CM | POA: Insufficient documentation

## 2024-03-01 DIAGNOSIS — F5101 Primary insomnia: Secondary | ICD-10-CM

## 2024-03-01 DIAGNOSIS — M21372 Foot drop, left foot: Secondary | ICD-10-CM | POA: Insufficient documentation

## 2024-03-01 DIAGNOSIS — F422 Mixed obsessional thoughts and acts: Secondary | ICD-10-CM

## 2024-03-01 MED ORDER — CLONAZEPAM 0.5 MG PO TABS: 0.5000 mg | ORAL_TABLET | Freq: Every day | ORAL | 2 refills | Status: AC

## 2024-03-01 NOTE — Assessment & Plan Note (Addendum)
 EMG/NCS scheduled for tomorrow. This continues to cause falls and tripping incidents regularly and she is afraid to do much walking.

## 2024-03-01 NOTE — Assessment & Plan Note (Signed)
 MRI showed significant marrow edema and facet hypertrophy on the left. She has been referred to Physiatry - is probably a good candidate for ESI

## 2024-03-01 NOTE — Progress Notes (Signed)
 Date:  03/01/2024   Name:  Robin Madden   DOB:  25-Oct-1965   MRN:  [id number]   Chief Complaint: Anxiety (Follow up on Clonazepam . )  Anxiety Presents for follow-up visit. Symptoms include insomnia and nervous/anxious behavior. Patient reports no chest pain, dizziness, palpitations or shortness of breath. Symptoms occur most days.   Compliance with medications is 76-100%.  Insomnia Primary symptoms: sleep disturbance, difficulty falling asleep, frequent awakening.   The symptoms are aggravated by alcohol. Treatments tried: switched from Ambien  to clonazepam  to help with alcohol cessation.  Neck Pain  This is a chronic problem. The problem occurs constantly. The problem has been unchanged. The quality of the pain is described as aching (shooting down left arm). The pain is moderate. Pertinent negatives include no chest pain, fever, headaches or trouble swallowing.  Alcohol disorder - she has reduced her overall intake by drinking wine and low alcohol beer and less liquor.  Review of Systems  Constitutional:  Negative for chills, fatigue and fever.  HENT:  Negative for trouble swallowing.   Respiratory:  Negative for chest tightness and shortness of breath.   Cardiovascular:  Negative for chest pain, palpitations and leg swelling.  Gastrointestinal:  Negative for abdominal pain and blood in stool.  Musculoskeletal:  Positive for arthralgias, gait problem, myalgias (pain from left neck down left arm) and neck pain.  Skin:  Negative for rash.  Neurological:  Negative for dizziness and headaches.  Psychiatric/Behavioral:  Positive for dysphoric mood and sleep disturbance. The patient is nervous/anxious and has insomnia.      Lab Results  Component Value Date   NA 138 12/22/2023   K 4.3 12/22/2023   CO2 24 12/22/2023   GLUCOSE 98 12/22/2023   BUN 12 12/22/2023   CREATININE 0.78 12/22/2023   CALCIUM 10.0 12/22/2023   EGFR 88 12/22/2023   GFRNONAA 91 04/06/2020   Lab  Results  Component Value Date   CHOL 340 (H) 12/22/2023   HDL 73 12/22/2023   LDLCALC 237 (H) 12/22/2023   TRIG 159 (H) 12/22/2023   CHOLHDL 4.7 (H) 12/22/2023   Lab Results  Component Value Date   TSH 1.730 12/22/2023   Lab Results  Component Value Date   HGBA1C 5.2 05/04/2021   Lab Results  Component Value Date   WBC 4.1 12/22/2023   HGB 13.5 12/22/2023   HCT 39.8 12/22/2023   MCV 113 (H) 12/22/2023   PLT 121 (L) 12/22/2023   Lab Results  Component Value Date   ALT 75 (H) 12/22/2023   AST 97 (H) 12/22/2023   ALKPHOS 118 12/22/2023   BILITOT 1.0 12/22/2023   Lab Results  Component Value Date   VD25OH 33.7 10/03/2017     Patient Active Problem List   Diagnosis Date Noted   Left foot drop 03/01/2024   Cervical radiculopathy 03/01/2024   Alcohol consumption of more than two drinks per day 12/22/2023   Overweight (BMI 25.0-29.9) 06/17/2023   Hepatic steatosis 01/07/2023   Restless legs syndrome (RLS) 01/02/2023   Mixed hyperlipidemia 10/06/2017   Carpal tunnel syndrome on left 09/20/2016   Neuritis of right ulnar nerve 08/22/2016   Bilateral wrist pain 07/18/2016   Acquired hypothyroidism 04/04/2015   OCD (obsessive compulsive disorder) 04/04/2015   Atypical facial pain 04/04/2015   Primary insomnia 04/04/2015   Macrocytosis 04/04/2015    No Known Allergies  Past Surgical History:  Procedure Laterality Date   CARPAL TUNNEL RELEASE Right 02/2017   TONSILLECTOMY  ULNAR COLLATERAL LIGAMENT REPAIR Right 09/06/2016   Procedure: RIGHT ULNA NERVE TRANSPORTATION;  Surgeon: Maryl Barters, MD;  Location: [facility];  Service: Orthopedics;  Laterality: Right;    Social History   Tobacco Use   Smoking status: Never   Smokeless tobacco: Never  Vaping Use   Vaping status: Never Used  Substance Use Topics   Alcohol use: Yes    Alcohol/week: 14.0 standard drinks of alcohol    Types: 7 Glasses of wine, 7 Shots of liquor per week    Comment: daily    Drug use: No     Medication list has been reviewed and updated.  Current Meds  Medication Sig   escitalopram  (LEXAPRO ) 20 MG tablet Take 1 tablet (20 mg total) by mouth daily.   Folic Acid-Vit B6-Vit B12 (FOLBEE) 2.5-25-1 MG TABS tablet Take 1 tablet by mouth daily.   gabapentin  (NEURONTIN ) 300 MG capsule Take 2 capsules (600 mg total) by mouth 2 (two) times daily.   levothyroxine  (SYNTHROID ) 100 MCG tablet TAKE 1 TABLET BY MOUTH EVERY DAY BEFORE BREAKFAST   Multiple Vitamins-Minerals (ZINC PO) Take by mouth.   VITAMIN D  PO Take by mouth.   [DISCONTINUED] Ascorbic Acid (VITAMIN C PO) Take by mouth.   [DISCONTINUED] clonazePAM  (KLONOPIN ) 0.5 MG tablet TAKE 1 TABLET BY MOUTH AT BEDTIME AS NEEDED FOR ANXIETY. AND 1/2 TAB DAILY AS NEEDED FOR ANXIETY       03/01/2024    9:44 AM 12/22/2023    8:41 AM 09/10/2023    1:20 PM 06/09/2023    8:37 AM  GAD 7 : Generalized Anxiety Score  Nervous, Anxious, on Edge 2 1 1  0  Control/stop worrying 2 0 0 1  Worry too much - different things 2 0 0 0  Trouble relaxing 0 0 0 0  Restless 0 0 0 0  Easily annoyed or irritable 1 1 0 0  Afraid - awful might happen 0 0 0 0  Total GAD 7 Score 7 2 1 1   Anxiety Difficulty Not difficult at all Not difficult at all Not difficult at all Not difficult at all       03/01/2024    9:44 AM 12/22/2023    8:41 AM 09/10/2023    1:20 PM  Depression screen PHQ 2/9  Decreased Interest 1 0 0  Down, Depressed, Hopeless 1 0 0  PHQ - 2 Score 2 0 0  Altered sleeping 0 0 0  Tired, decreased energy 0 0 0  Change in appetite 0 0 0  Feeling bad or failure about yourself  0 0 0  Trouble concentrating 0 0 0  Moving slowly or fidgety/restless 0 0 0  Suicidal thoughts 0 0 0  PHQ-9 Score 2 0  0   Difficult doing work/chores Not difficult at all Not difficult at all Not difficult at all     Data saved with a previous flowsheet row definition    BP Readings from Last 3 Encounters:  03/01/24 132/78  12/22/23 124/72   09/10/23 124/78    Physical Exam Constitutional:      Appearance: Normal appearance.  Cardiovascular:     Rate and Rhythm: Normal rate and regular rhythm.     Heart sounds: No murmur heard. Pulmonary:     Effort: Pulmonary effort is normal.     Breath sounds: No wheezing or rhonchi.  Musculoskeletal:     Cervical back: Normal range of motion.     Right lower leg: No edema.  Left lower leg: No edema.  Lymphadenopathy:     Cervical: No cervical adenopathy.  Skin:    Capillary Refill: Capillary refill takes less than 2 seconds.  Neurological:     Mental Status: She is alert.     Gait: Gait abnormal.     Comments: Left foot drop     Wt Readings from Last 3 Encounters:  03/01/24 192 lb (87.1 kg)  12/22/23 182 lb (82.6 kg)  09/10/23 181 lb (82.1 kg)    BP 132/78   Pulse 92   Ht 5' 4 (1.626 m)   Wt 192 lb (87.1 kg)   LMP 07/15/2017 (Exact Date)   SpO2 98%   BMI 32.96 kg/m   Assessment and Plan:  Problem List Items Addressed This Visit       Unprioritized   OCD (obsessive compulsive disorder) (Chronic)   Depression and anxiety symptoms are stable and well controlled on Lexapro . No SI/HI reported. I recommend continuing the same medical regimen.       Relevant Medications   clonazePAM  (KLONOPIN ) 0.5 MG tablet (Start on 03/26/2024)   Primary insomnia   She is doing much better on Clonazepam  nightly.  Her nightmares have resolved; she still has other OCD related dreams Will refill until her new PCP appointment.      Hepatic steatosis (Chronic)   She continues cut back on alcohol. She is not taking Tylenol ; uses Aleve if needed. Recommend rechecking labs next visit      Alcohol consumption of more than two drinks per day - Primary   She is cutting back her intake on her own. Much less liquor - instead drinking wine or low alcohol beer.      Left foot drop   EMG/NCS scheduled for tomorrow. This continues to cause falls and tripping incidents regularly  and she is afraid to do much walking.      Cervical radiculopathy   MRI showed significant marrow edema and facet hypertrophy on the left. She has been referred to Physiatry - is probably a good candidate for ESI      Relevant Medications   clonazePAM  (KLONOPIN ) 0.5 MG tablet (Start on 03/26/2024)    No follow-ups on file.    Leita HILARIO Adie, MD [facility] [place]

## 2024-03-01 NOTE — Assessment & Plan Note (Signed)
 Depression and anxiety symptoms are stable and well controlled on Lexapro . No SI/HI reported. I recommend continuing the same medical regimen.

## 2024-03-01 NOTE — Assessment & Plan Note (Signed)
 She is doing much better on Clonazepam  nightly.  Her nightmares have resolved; she still has other OCD related dreams Will refill until her new PCP appointment.

## 2024-03-01 NOTE — Assessment & Plan Note (Signed)
 She is cutting back her intake on her own. Much less liquor - instead drinking wine or low alcohol beer.

## 2024-03-01 NOTE — Assessment & Plan Note (Signed)
 She continues cut back on alcohol. She is not taking Tylenol ; uses Aleve if needed. Recommend rechecking labs next visit

## 2024-03-09 ENCOUNTER — Other Ambulatory Visit: Payer: Self-pay | Admitting: Physical Medicine & Rehabilitation

## 2024-03-09 DIAGNOSIS — M5416 Radiculopathy, lumbar region: Secondary | ICD-10-CM

## 2024-03-09 DIAGNOSIS — M5412 Radiculopathy, cervical region: Secondary | ICD-10-CM

## 2024-03-15 NOTE — Discharge Instructions (Signed)

## 2024-03-16 ENCOUNTER — Inpatient Hospital Stay
Admission: RE | Admit: 2024-03-16 | Discharge: 2024-03-16 | Disposition: A | Discharge status: Home / Self Care | Source: Ambulatory Visit | Attending: Physical Medicine & Rehabilitation | Admitting: Physical Medicine & Rehabilitation

## 2024-03-18 ENCOUNTER — Encounter: Payer: Self-pay | Admitting: Internal Medicine

## 2024-03-19 ENCOUNTER — Other Ambulatory Visit: Payer: Self-pay | Admitting: Internal Medicine

## 2024-03-19 NOTE — Progress Notes (Unsigned)
 "   Date:  03/19/2024   Name:  Robin Madden   DOB:  [birth year]   MRN:  [id number]   Chief Complaint: No chief complaint on file.  HPI  Review of Systems   Lab Results  Component Value Date   NA 138 12/22/2023   K 4.3 12/22/2023   CO2 24 12/22/2023   GLUCOSE 98 12/22/2023   BUN 12 12/22/2023   CREATININE 0.78 12/22/2023   CALCIUM 10.0 12/22/2023   EGFR 88 12/22/2023   GFRNONAA 91 04/06/2020   Lab Results  Component Value Date   CHOL 340 (H) 12/22/2023   HDL 73 12/22/2023   LDLCALC 237 (H) 12/22/2023   TRIG 159 (H) 12/22/2023   CHOLHDL 4.7 (H) 12/22/2023   Lab Results  Component Value Date   TSH 1.730 12/22/2023   Lab Results  Component Value Date   HGBA1C 5.2 05/04/2021   Lab Results  Component Value Date   WBC 4.1 12/22/2023   HGB 13.5 12/22/2023   HCT 39.8 12/22/2023   MCV 113 (H) 12/22/2023   PLT 121 (L) 12/22/2023   Lab Results  Component Value Date   ALT 75 (H) 12/22/2023   AST 97 (H) 12/22/2023   ALKPHOS 118 12/22/2023   BILITOT 1.0 12/22/2023   Lab Results  Component Value Date   VD25OH 33.7 10/03/2017     Patient Active Problem List   Diagnosis Date Noted   Left foot drop 03/01/2024   Cervical radiculopathy 03/01/2024   Alcohol consumption of more than two drinks per day 12/22/2023   Overweight (BMI 25.0-29.9) 06/17/2023   Hepatic steatosis 01/07/2023   Restless legs syndrome (RLS) 01/02/2023   Mixed hyperlipidemia 10/06/2017   Carpal tunnel syndrome on left 09/20/2016   Neuritis of right ulnar nerve 08/22/2016   Bilateral wrist pain 07/18/2016   Acquired hypothyroidism 04/04/2015   OCD (obsessive compulsive disorder) 04/04/2015   Atypical facial pain 04/04/2015   Primary insomnia 04/04/2015   Macrocytosis 04/04/2015    Allergies[1]  Past Surgical History:  Procedure Laterality Date   CARPAL TUNNEL RELEASE Right 02/2017   TONSILLECTOMY     ULNAR COLLATERAL LIGAMENT REPAIR Right 09/06/2016   Procedure: RIGHT ULNA NERVE  TRANSPORTATION;  Surgeon: Maryl Barters, MD;  Location: [facility];  Service: Orthopedics;  Laterality: Right;    Social History[2]   Medication list has been reviewed and updated.  Active Medications[3]     03/01/2024    9:44 AM 12/22/2023    8:41 AM 09/10/2023    1:20 PM 06/09/2023    8:37 AM  GAD 7 : Generalized Anxiety Score  Nervous, Anxious, on Edge 2 1 1  0  Control/stop worrying 2 0 0 1  Worry too much - different things 2 0 0 0  Trouble relaxing 0 0 0 0  Restless 0 0 0 0  Easily annoyed or irritable 1 1 0 0  Afraid - awful might happen 0 0 0 0  Total GAD 7 Score 7 2 1 1   Anxiety Difficulty Not difficult at all Not difficult at all Not difficult at all Not difficult at all       03/01/2024    9:44 AM 12/22/2023    8:41 AM 09/10/2023    1:20 PM  Depression screen PHQ 2/9  Decreased Interest 1 0 0  Down, Depressed, Hopeless 1 0 0  PHQ - 2 Score 2 0 0  Altered sleeping 0 0 0  Tired, decreased energy 0 0 0  Change  in appetite 0 0 0  Feeling bad or failure about yourself  0 0 0  Trouble concentrating 0 0 0  Moving slowly or fidgety/restless 0 0 0  Suicidal thoughts 0 0 0  PHQ-9 Score 2 0  0   Difficult doing work/chores Not difficult at all Not difficult at all Not difficult at all     Data saved with a previous flowsheet row definition    BP Readings from Last 3 Encounters:  03/01/24 132/78  12/22/23 124/72  09/10/23 124/78    Physical Exam  Wt Readings from Last 3 Encounters:  03/01/24 192 lb (87.1 kg)  12/22/23 182 lb (82.6 kg)  09/10/23 181 lb (82.1 kg)    LMP 07/15/2017   Assessment and Plan:  Problem List Items Addressed This Visit   None   No follow-ups on file.    Leita HILARIO Adie, MD [facility] [place]           [1] No Known Allergies [2]  Social History Tobacco Use   Smoking status: Never   Smokeless tobacco: Never  Vaping Use   Vaping status: Never Used  Substance Use  Topics   Alcohol use: Yes    Alcohol/week: 14.0 standard drinks of alcohol    Types: 7 Glasses of wine, 7 Shots of liquor per week    Comment: daily   Drug use: No  [3]  No outpatient medications have been marked as taking for the 03/19/24 encounter (Orders Only) with Adie Leita DEL, MD.   "

## 2024-03-19 NOTE — Telephone Encounter (Signed)
 Please send in RX if appropriate.  JM

## 2024-03-22 ENCOUNTER — Ambulatory Visit
Admission: RE | Admit: 2024-03-22 | Discharge: 2024-03-22 | Disposition: A | Source: Ambulatory Visit | Attending: Physical Medicine & Rehabilitation | Admitting: Physical Medicine & Rehabilitation

## 2024-03-22 DIAGNOSIS — M5416 Radiculopathy, lumbar region: Secondary | ICD-10-CM

## 2024-03-22 NOTE — Telephone Encounter (Signed)
 Please review and advise.    Thank you.

## 2024-03-31 NOTE — Discharge Instructions (Signed)

## 2024-04-01 ENCOUNTER — Ambulatory Visit
Admission: RE | Admit: 2024-04-01 | Discharge: 2024-04-01 | Disposition: A | Discharge status: Home / Self Care | Source: Ambulatory Visit | Attending: Physical Medicine & Rehabilitation | Admitting: Physical Medicine & Rehabilitation

## 2024-04-01 DIAGNOSIS — M5412 Radiculopathy, cervical region: Secondary | ICD-10-CM

## 2024-04-01 MED ORDER — TRIAMCINOLONE ACETONIDE 40 MG/ML IJ SUSP (RADIOLOGY)
60.0000 mg | Freq: Once | INTRAMUSCULAR | Status: AC
  Administered 2024-04-01: 60 mg via EPIDURAL

## 2024-04-01 MED ORDER — IOPAMIDOL (ISOVUE-300) INJECTION 61%
3.0000 mL | Freq: Once | INTRAVENOUS | Status: AC | PRN
  Administered 2024-04-01: 3 mL

## 2024-04-08 ENCOUNTER — Encounter: Payer: Self-pay | Admitting: Student

## 2024-04-09 ENCOUNTER — Other Ambulatory Visit: Payer: Self-pay | Admitting: Medical Genetics

## 2024-04-16 ENCOUNTER — Other Ambulatory Visit
Admission: RE | Admit: 2024-04-16 | Discharge: 2024-04-16 | Disposition: A | Payer: Self-pay | Source: Ambulatory Visit | Attending: Medical Genetics | Admitting: Medical Genetics

## 2024-04-16 ENCOUNTER — Encounter: Payer: Self-pay | Admitting: Student

## 2024-04-16 NOTE — Telephone Encounter (Signed)
 Left voice mail to set up appointment for acute visit, ok to see another pcp for acute only for blood pressure. Please also schedule a transfer of care to which the patient prefers to see.

## 2024-04-16 NOTE — Telephone Encounter (Signed)
 Please review.  KP

## 2024-04-16 NOTE — Telephone Encounter (Signed)
 Any appts with another provider?  KP

## 2024-04-16 NOTE — Telephone Encounter (Signed)
 Please call pt to schedule a sooner appt. TOC if possible.  KP

## 2024-04-21 ENCOUNTER — Telehealth: Payer: Self-pay

## 2024-04-21 NOTE — Telephone Encounter (Signed)
 Left message for patient, change in appointments.

## 2024-04-23 ENCOUNTER — Ambulatory Visit: Admitting: Student

## 2024-04-28 LAB — GENECONNECT MOLECULAR SCREEN: Genetic Analysis Overall Interpretation: NEGATIVE

## 2024-04-29 ENCOUNTER — Ambulatory Visit: Admitting: Physician Assistant

## 2024-04-29 ENCOUNTER — Telehealth: Payer: Self-pay | Admitting: Physician Assistant

## 2024-04-29 ENCOUNTER — Other Ambulatory Visit (HOSPITAL_COMMUNITY): Payer: Self-pay

## 2024-04-29 ENCOUNTER — Encounter: Payer: Self-pay | Admitting: Physician Assistant

## 2024-04-29 VITALS — BP 150/78 | HR 90 | Temp 98.8°F | Ht 64.0 in | Wt 197.2 lb

## 2024-04-29 DIAGNOSIS — K7581 Nonalcoholic steatohepatitis (NASH): Secondary | ICD-10-CM | POA: Insufficient documentation

## 2024-04-29 DIAGNOSIS — K709 Alcoholic liver disease, unspecified: Secondary | ICD-10-CM | POA: Insufficient documentation

## 2024-04-29 DIAGNOSIS — E782 Mixed hyperlipidemia: Secondary | ICD-10-CM

## 2024-04-29 DIAGNOSIS — I10 Essential (primary) hypertension: Secondary | ICD-10-CM | POA: Insufficient documentation

## 2024-04-29 DIAGNOSIS — E66811 Obesity, class 1: Secondary | ICD-10-CM | POA: Insufficient documentation

## 2024-04-29 DIAGNOSIS — F1020 Alcohol dependence, uncomplicated: Secondary | ICD-10-CM

## 2024-04-29 MED ORDER — VALSARTAN 80 MG PO TABS: 80.0000 mg | ORAL_TABLET | Freq: Every day | ORAL | 3 refills | Status: AC

## 2024-04-29 MED ORDER — NALTREXONE HCL 50 MG PO TABS: 50.0000 mg | ORAL_TABLET | Freq: Every day | ORAL | 1 refills | Status: AC

## 2024-04-29 NOTE — Addendum Note (Signed)
 Addended by: CORALEE STEVA SAILOR on: 04/29/2024 01:45 PM   Modules accepted: Orders

## 2024-04-29 NOTE — Assessment & Plan Note (Signed)
 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  Both need to be addressed.

## 2024-04-29 NOTE — Telephone Encounter (Signed)
 Plan/Benefit Exclusion

## 2024-04-29 NOTE — Assessment & Plan Note (Signed)
 LDL greater than 200 and persistent for years.  Would like to start pharmacotherapy for this problem, but I feel this takes a lower priority at this time.  I am hesitant to begin antilipid therapy with her current liver function.  We discussed the relationship of alcohol, liver function, and cholesterol metabolism.

## 2024-04-29 NOTE — Telephone Encounter (Signed)
 Please check Zepbound  coverage for class 1 obesity with serious comorbidities of major depression, hyperlipidemia, hypertension, and metabolic associated liver disease. Thanks!

## 2024-04-29 NOTE — Assessment & Plan Note (Signed)
 Elevated in the clinic and at home.  Begin valsartan  80 mg daily.  Continue home BP monitoring with short interval follow-up Orders:   valsartan  (DIOVAN ) 80 MG tablet; Take 1 tablet (80 mg total) by mouth daily.

## 2024-04-29 NOTE — Assessment & Plan Note (Signed)
 Will check coverage for Zepbound  but not prescribing today.  Patient advised she will need a separate appointment with me for comprehensive nutrition and activity evaluation.  No doubt alcohol consumption is contributory which we briefly reviewed today.  GLP medication would likely physically limit her ability to consume alcohol by reducing GI motility.

## 2024-04-29 NOTE — Progress Notes (Signed)
 "   Date:  04/29/2024   Name:  Robin  GRAYLEE Madden   DOB:  1965/06/04   MRN:  [id number]   Chief Complaint: Hypertension (perdium)  Hypertension    Robin Madden  is a medically complex 59 y.o. female with history of obesity, alcohol use disorder, metabolic/alcoholic liver disease, and HLD new to me today as transfer of care from my recently retired animator Dr. Justus.  Alcohol abuse has been a long-term problem for her, she attributes this to severe psychiatric distress for which she self medicates.  She has been working on reducing her alcohol consumption but presently reports it has at least 2 drinks on weekdays and probably 4-5 drinks each day of the weekend.  For the last several months she has noticed increased blood pressure in the 140s-150s systolic, brings a log for my review.  She has also gained nearly 25 pounds in the last year and is interested in GLP medication.  Previously used Wegovy  but this made her very sick with nausea and vomiting.  She is interested in Zepbound  specifically.  Labs in September 2025 with numerous abnormalities related to alcohol use including persistent thrombocytopenia with platelets 121 last check with a pattern reflecting gradual decline (patient complains of easy bleeding), macrocytosis with MCV 113, alcoholic liver insult with AST 97 and ALT 75 (chronic, worsening), lipids extremely elevated over 200 for the last 4 years (pharmacotherapy delayed due to liver function).  Genetic testing did not reveal any variants suggestive of FH, which is consistent with the patient's history of having normal cholesterol when she was younger; the oldest lipid panel I can see on record was from 2017 with LDL 172.  She also has chronic polyneuropathy and left foot drop which we did not get into today, but might be toxic neuropathy from chronic alcohol abuse.   Medication list has been reviewed and updated.  Active Medications[1]   Review of Systems  Patient Active  Problem List   Diagnosis Date Noted   Primary hypertension 04/29/2024   Class 1 obesity 04/29/2024   Chronic alcoholic liver disease 04/29/2024   Metabolic dysfunction-associated steatohepatitis (MASH) 04/29/2024   Left foot drop 03/01/2024   Cervical radiculopathy 03/01/2024   Alcohol use disorder, moderate, dependence (HCC) 12/22/2023   Hepatic steatosis 01/07/2023   Restless legs syndrome (RLS) 01/02/2023   Mixed hyperlipidemia 10/06/2017   Carpal tunnel syndrome on left 09/20/2016   Neuritis of right ulnar nerve 08/22/2016   Bilateral wrist pain 07/18/2016   Acquired hypothyroidism 04/04/2015   OCD (obsessive compulsive disorder) 04/04/2015   Atypical facial pain 04/04/2015   Primary insomnia 04/04/2015   Macrocytosis 04/04/2015    Allergies[2]  Immunization History  Administered Date(s) Administered   Influenza, Seasonal, Injecte, Preservative Fre 01/02/2023, 12/22/2023   Influenza,inj,Quad PF,6+ Mos 04/04/2015, 01/12/2016, 01/06/2019, 02/02/2020, 01/10/2021, 12/19/2021   Influenza-Unspecified 01/06/2019   Moderna Covid-19 Vaccine Bivalent Booster 51yrs & up 01/10/2021   Moderna Sars-Covid-2 Vaccination 06/07/2019, 07/08/2019, 02/02/2020, 10/18/2020   Pfizer(Comirnaty)Fall Seasonal Vaccine 12 years and older 01/02/2023   Tdap 09/20/2015   Unspecified SARS-COV-2 Vaccination 12/25/2021   Zoster Recombinant(Shingrix ) 10/03/2018, 01/06/2019    Past Surgical History:  Procedure Laterality Date   CARPAL TUNNEL RELEASE Right 02/2017   TONSILLECTOMY     ULNAR COLLATERAL LIGAMENT REPAIR Right 09/06/2016   Procedure: RIGHT ULNA NERVE TRANSPORTATION;  Surgeon: Maryl Barters, MD;  Location: [facility];  Service: Orthopedics;  Laterality: Right;    Social History[3]  Family History  Problem Relation Age of Onset  Mental illness Mother    COPD Mother    Melanoma Brother    CAD Brother    COPD Brother    Breast cancer Neg Hx         04/29/2024   10:27 AM  03/01/2024    9:44 AM 12/22/2023    8:41 AM 09/10/2023    1:20 PM  GAD 7 : Generalized Anxiety Score  Nervous, Anxious, on Edge 2 2  1  1    Control/stop worrying 2 2  0  0   Worry too much - different things 2 2  0  0   Trouble relaxing 0 0  0  0   Restless 0 0  0  0   Easily annoyed or irritable 1 1  1   0   Afraid - awful might happen 0 0  0  0   Total GAD 7 Score 7 7 2 1   Anxiety Difficulty Not difficult at all Not difficult at all Not difficult at all Not difficult at all     Data saved with a previous flowsheet row definition       04/29/2024   10:28 AM 03/01/2024    9:44 AM 12/22/2023    8:41 AM  Depression screen PHQ 2/9  Decreased Interest 1 1 0  Down, Depressed, Hopeless 1 1 0  PHQ - 2 Score 2 2 0  Altered sleeping 0 0 0  Tired, decreased energy 0 0 0  Change in appetite 0 0 0  Feeling bad or failure about yourself  0 0 0  Trouble concentrating 0 0 0  Moving slowly or fidgety/restless 0 0 0  Suicidal thoughts 0 0 0  PHQ-9 Score 2 2 0   Difficult doing work/chores Not difficult at all Not difficult at all Not difficult at all     Data saved with a previous flowsheet row definition    BP Readings from Last 3 Encounters:  04/29/24 (!) 150/92  04/01/24 (!) 172/76  03/01/24 132/78    Wt Readings from Last 3 Encounters:  04/29/24 197 lb 3.2 oz (89.4 kg)  03/01/24 192 lb (87.1 kg)  12/22/23 182 lb (82.6 kg)    BP (!) 150/92   Pulse 90   Temp 98.8 F (37.1 C) (Oral)   Ht 5' 4 (1.626 m)   Wt 197 lb 3.2 oz (89.4 kg)   LMP 07/15/2017   SpO2 97%   BMI 33.85 kg/m   Physical Exam Vitals and nursing note reviewed.  Constitutional:      Appearance: Normal appearance. She is obese.  Eyes:     General: No scleral icterus. Cardiovascular:     Rate and Rhythm: Normal rate.  Pulmonary:     Effort: Pulmonary effort is normal.  Abdominal:     General: There is no distension.  Musculoskeletal:        General: Normal range of motion.  Skin:    General: Skin is  warm and dry.  Neurological:     Mental Status: She is alert and oriented to person, place, and time.     Gait: Gait is intact.  Psychiatric:        Mood and Affect: Mood and affect normal.     Recent Labs     Component Value Date/Time   NA 138 12/22/2023 0923   K 4.3 12/22/2023 0923   CL 98 12/22/2023 0923   CO2 24 12/22/2023 0923   GLUCOSE 98 12/22/2023 0923   BUN 12 12/22/2023  9076   CREATININE 0.78 12/22/2023 0923   CALCIUM 10.0 12/22/2023 0923   PROT 7.6 12/22/2023 0923   ALBUMIN 4.6 12/22/2023 0923   AST 97 (H) 12/22/2023 0923   ALT 75 (H) 12/22/2023 0923   ALKPHOS 118 12/22/2023 0923   BILITOT 1.0 12/22/2023 0923   GFRNONAA 91 04/06/2020 0943   GFRAA 105 04/06/2020 0943    Lab Results  Component Value Date   WBC 4.1 12/22/2023   HGB 13.5 12/22/2023   HCT 39.8 12/22/2023   MCV 113 (H) 12/22/2023   PLT 121 (L) 12/22/2023   Lab Results  Component Value Date   HGBA1C 5.2 05/04/2021   Lab Results  Component Value Date   CHOL 340 (H) 12/22/2023   HDL 73 12/22/2023   LDLCALC 237 (H) 12/22/2023   TRIG 159 (H) 12/22/2023   CHOLHDL 4.7 (H) 12/22/2023   Lab Results  Component Value Date   TSH 1.730 12/22/2023        Assessment & Plan Primary hypertension Elevated in the clinic and at home.  Begin valsartan  80 mg daily.  Continue home BP monitoring with short interval follow-up Orders:   valsartan  (DIOVAN ) 80 MG tablet; Take 1 tablet (80 mg total) by mouth daily.  Alcohol use disorder, moderate, dependence (HCC) We discussed the various lab abnormalities suggesting widespread chronic damage from alcohol use disorder.  Emphasized with patient this is priority #1 above all else including weight loss.  She is agreeable to trial of naltrexone  which should help both with the alcohol use disorder and might even result in modest weight loss. Orders:   naltrexone  (DEPADE) 50 MG tablet; Take 1 tablet (50 mg total) by mouth daily.  Class 1 obesity Will check  coverage for Zepbound  but not prescribing today.  Patient advised she will need a separate appointment with me for comprehensive nutrition and activity evaluation.  No doubt alcohol consumption is contributory which we briefly reviewed today.  GLP medication would likely physically limit her ability to consume alcohol by reducing GI motility.    Mixed hyperlipidemia LDL greater than 200 and persistent for years.  Would like to start pharmacotherapy for this problem, but I feel this takes a lower priority at this time.  I am hesitant to begin antilipid therapy with her current liver function.  We discussed the relationship of alcohol, liver function, and cholesterol metabolism.    Metabolic dysfunction-associated steatohepatitis (MASH) Certainly believe there to be overlap of metabolic and alcohol associated liver disease.  Both need to be addressed.    Chronic alcoholic liver disease Certainly believe there to be overlap of metabolic and alcohol associated liver disease.  Both need to be addressed.      Follow-up as scheduled in 2 weeks OV HTN, alcohol, GLP start   Rolan Hoyle, PA-C, DMSc, DipACLM, Nutritionist [facility] [phone]      [1]  Current Meds  Medication Sig   clonazePAM  (KLONOPIN ) 0.5 MG tablet Take 1 tablet (0.5 mg total) by mouth at bedtime. And 1/2 tablet daily prn anxiety   cyanocobalamin (VITAMIN B12) 1000 MCG tablet Take 1,000 mcg by mouth daily.   escitalopram  (LEXAPRO ) 20 MG tablet Take 1 tablet (20 mg total) by mouth daily.   Folic Acid-Vit B6-Vit B12 (FOLBEE) 2.5-25-1 MG TABS tablet Take 1 tablet by mouth daily.   gabapentin  (NEURONTIN ) 300 MG capsule Take 2 capsules (600 mg total) by mouth 2 (two) times daily. (Patient  taking differently: Take 600 mg by mouth 2 (two) times daily. 900 MG Bedtime, 300 MG morning)   levothyroxine  (SYNTHROID ) 100 MCG tablet TAKE 1 TABLET BY MOUTH  EVERY DAY BEFORE BREAKFAST   LORazepam (ATIVAN) 1 MG tablet    naltrexone  (DEPADE) 50 MG tablet Take 1 tablet (50 mg total) by mouth daily.   valsartan  (DIOVAN ) 80 MG tablet Take 1 tablet (80 mg total) by mouth daily.   VITAMIN D  PO Take by mouth.   zolpidem  (AMBIEN ) 5 MG tablet Take 5 mg by mouth at bedtime as needed. for sleep  [2] No Known Allergies [3]  Social History Tobacco Use   Smoking status: Never   Smokeless tobacco: Never  Vaping Use   Vaping status: Never Used  Substance Use Topics   Alcohol use: Yes    Alcohol/week: 14.0 standard drinks of alcohol    Types: 7 Glasses of wine, 7 Shots of liquor per week    Comment: daily   Drug use: No   "

## 2024-04-29 NOTE — Assessment & Plan Note (Signed)
 We discussed the various lab abnormalities suggesting widespread chronic damage from alcohol use disorder.  Emphasized with patient this is priority #1 above all else including weight loss.  She is agreeable to trial of naltrexone  which should help both with the alcohol use disorder and might even result in modest weight loss. Orders:   naltrexone  (DEPADE) 50 MG tablet; Take 1 tablet (50 mg total) by mouth daily.

## 2024-05-11 ENCOUNTER — Encounter: Admitting: Physician Assistant

## 2024-06-21 ENCOUNTER — Encounter: Admitting: Student

## 2024-12-23 ENCOUNTER — Encounter: Admitting: Physician Assistant
